# Patient Record
Sex: Female | Born: 2000 | Race: Black or African American | Hispanic: No | Marital: Single | Smoking: Current every day smoker
Health system: Southern US, Community
[De-identification: ages and names within clinical notes are randomized; demographics above are authoritative.]

## PROBLEM LIST (undated history)

## (undated) DIAGNOSIS — L732 Hidradenitis suppurativa: Secondary | ICD-10-CM

## (undated) DIAGNOSIS — F209 Schizophrenia, unspecified: Secondary | ICD-10-CM

---
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
## 2020-04-09 ENCOUNTER — Other Ambulatory Visit: Payer: Self-pay

## 2020-04-09 ENCOUNTER — Ambulatory Visit (HOSPITAL_COMMUNITY)
Admission: EM | Admit: 2020-04-09 | Discharge: 2020-04-10 | Disposition: A | Discharge status: Other Acute Inpatient Hospital | Payer: Medicaid Other | Attending: Psychiatry | Admitting: Psychiatry

## 2020-04-09 DIAGNOSIS — Z20822 Contact with and (suspected) exposure to covid-19: Secondary | ICD-10-CM | POA: Insufficient documentation

## 2020-04-09 DIAGNOSIS — F29 Unspecified psychosis not due to a substance or known physiological condition: Secondary | ICD-10-CM | POA: Insufficient documentation

## 2020-04-09 DIAGNOSIS — Z818 Family history of other mental and behavioral disorders: Secondary | ICD-10-CM | POA: Insufficient documentation

## 2020-04-09 DIAGNOSIS — Z046 Encounter for general psychiatric examination, requested by authority: Secondary | ICD-10-CM | POA: Insufficient documentation

## 2020-04-09 LAB — RESP PANEL BY RT-PCR (FLU A&B, COVID) ARPGX2
Influenza A by PCR: NEGATIVE
Influenza B by PCR: NEGATIVE
SARS Coronavirus 2 by RT PCR: NEGATIVE

## 2020-04-09 LAB — CBC WITH DIFFERENTIAL/PLATELET
Abs Immature Granulocytes: 0.01 10*3/uL (ref 0.00–0.07)
Basophils Absolute: 0 10*3/uL (ref 0.0–0.1)
Basophils Relative: 0 %
Eosinophils Absolute: 0.1 10*3/uL (ref 0.0–0.5)
Eosinophils Relative: 2 %
HCT: 41.9 % (ref 36.0–46.0)
Hemoglobin: 13.9 g/dL (ref 12.0–15.0)
Immature Granulocytes: 0 %
Lymphocytes Relative: 46 %
Lymphs Abs: 2.4 10*3/uL (ref 0.7–4.0)
MCH: 30.8 pg (ref 26.0–34.0)
MCHC: 33.2 g/dL (ref 30.0–36.0)
MCV: 92.9 fL (ref 80.0–100.0)
Monocytes Absolute: 0.7 10*3/uL (ref 0.1–1.0)
Monocytes Relative: 13 %
Neutro Abs: 2 10*3/uL (ref 1.7–7.7)
Neutrophils Relative %: 39 %
Platelets: 310 10*3/uL (ref 150–400)
RBC: 4.51 MIL/uL (ref 3.87–5.11)
RDW: 12.5 % (ref 11.5–15.5)
WBC: 5.2 10*3/uL (ref 4.0–10.5)
nRBC: 0 % (ref 0.0–0.2)

## 2020-04-09 LAB — POCT URINE DRUG SCREEN - MANUAL ENTRY (I-SCREEN)
POC Amphetamine UR: NOT DETECTED
POC Buprenorphine (BUP): NOT DETECTED
POC Cocaine UR: NOT DETECTED
POC Morphine: NOT DETECTED
POC Oxazepam (BZO): NOT DETECTED
POC Oxycodone UR: NOT DETECTED

## 2020-04-09 LAB — ETHANOL: Alcohol, Ethyl (B): <10 mg/dL (ref <10)

## 2020-04-09 LAB — POCT URINE DRUG SCREEN - MANUAL ENTRY (I-CUP)
POC Marijuana UR: POSITIVE — AB
POC Methadone UR: NOT DETECTED
POC Methamphetamine UR: NOT DETECTED
POC Secobarbital (BAR): NOT DETECTED

## 2020-04-09 LAB — COMPREHENSIVE METABOLIC PANEL
ALT: 17 U/L (ref 0–44)
AST: 23 U/L (ref 15–41)
Albumin: 4.3 g/dL (ref 3.5–5.0)
Alkaline Phosphatase: 60 U/L (ref 38–126)
Anion gap: 5 (ref 5–15)
BUN: 12 mg/dL (ref 6–20)
CO2: 28 mmol/L (ref 22–32)
Calcium: 9.3 mg/dL (ref 8.9–10.3)
Chloride: 104 mmol/L (ref 98–111)
Creatinine, Ser: 0.81 mg/dL (ref 0.44–1.00)
GFR, Estimated: >60 mL/min (ref >60)
Glucose, Bld: 74 mg/dL (ref 70–99)
Potassium: 4.4 mmol/L (ref 3.5–5.1)
Sodium: 137 mmol/L (ref 135–145)
Total Bilirubin: 0.6 mg/dL (ref 0.3–1.2)
Total Protein: 7.3 g/dL (ref 6.5–8.1)

## 2020-04-09 LAB — HEMOGLOBIN A1C
Hgb A1c MFr Bld: 5.3 % (ref 4.8–5.6)
Mean Plasma Glucose: 105.41 mg/dL

## 2020-04-09 LAB — TSH: TSH: 0.662 u[IU]/mL (ref 0.350–4.500)

## 2020-04-09 LAB — POC SARS CORONAVIRUS 2 AG: SARS Coronavirus 2 Ag: NEGATIVE

## 2020-04-09 LAB — LIPID PANEL
Cholesterol: 238 mg/dL — ABNORMAL HIGH (ref 0–200)
HDL: 93 mg/dL (ref >40)
LDL Cholesterol: 134 mg/dL — ABNORMAL HIGH (ref 0–99)
Total CHOL/HDL Ratio: 2.6 RATIO
Triglycerides: 54 mg/dL (ref <150)
VLDL: 11 mg/dL (ref 0–40)

## 2020-04-09 LAB — HCG, QUANTITATIVE, PREGNANCY: hCG, Beta Chain, Quant, S: <1 m[IU]/mL (ref <5)

## 2020-04-09 LAB — POCT PREGNANCY, URINE: Preg Test, Ur: NEGATIVE

## 2020-04-09 MED ORDER — LORAZEPAM 1 MG PO TABS: 1.0000 mg | ORAL_TABLET | ORAL | Status: DC | PRN

## 2020-04-09 MED ORDER — LORAZEPAM 2 MG/ML IJ SOLN
1.0000 mg | Freq: Once | INTRAMUSCULAR | Status: AC
  Administered 2020-04-09: 1 mg via INTRAMUSCULAR
  Filled 2020-04-09: qty 1

## 2020-04-09 MED ORDER — HYDROXYZINE HCL 25 MG PO TABS: 25.0000 mg | ORAL_TABLET | Freq: Three times a day (TID) | ORAL | Status: DC | PRN

## 2020-04-09 MED ORDER — OLANZAPINE 5 MG PO TBDP: 5.0000 mg | ORAL_TABLET | Freq: Three times a day (TID) | ORAL | Status: DC | PRN

## 2020-04-09 MED ORDER — MAGNESIUM HYDROXIDE 400 MG/5ML PO SUSP: 30.0000 mL | Freq: Every day | ORAL | Status: DC | PRN

## 2020-04-09 MED ORDER — TRAZODONE HCL 50 MG PO TABS: 50.0000 mg | ORAL_TABLET | Freq: Every evening | ORAL | Status: DC | PRN

## 2020-04-09 MED ORDER — ACETAMINOPHEN 325 MG PO TABS: 650.0000 mg | ORAL_TABLET | Freq: Four times a day (QID) | ORAL | Status: DC | PRN

## 2020-04-09 MED ORDER — ZIPRASIDONE MESYLATE 20 MG IM SOLR
20.0000 mg | INTRAMUSCULAR | Status: AC | PRN
  Administered 2020-04-09: 20 mg via INTRAMUSCULAR
  Filled 2020-04-09: qty 20

## 2020-04-09 MED ORDER — OLANZAPINE 5 MG PO TABS: 5.0000 mg | ORAL_TABLET | Freq: Once | ORAL | Status: DC

## 2020-04-09 MED ORDER — ALUM & MAG HYDROXIDE-SIMETH 200-200-20 MG/5ML PO SUSP: 30.0000 mL | ORAL | Status: DC | PRN

## 2020-04-09 NOTE — Progress Notes (Signed)
Patient admitted to [facility] under IVC. Patient is labile, hyperreligious, Constantly yelling. Refuses oral medications. Order placed for manual hold.

## 2020-04-09 NOTE — ED Notes (Signed)
Pt agitated, refusing to transfer from rm 136 to [facility] unit.  [employer] at bedside to assist with transfer to [facility].  STARR Packet initiated.  Pt tolerated injections without incident.

## 2020-04-09 NOTE — ED Notes (Signed)
Pt sleeping at present, sedated, respirations even & unlabored.  No distress noted. Monitoring for safety.

## 2020-04-09 NOTE — ED Notes (Signed)
Pt sleeping@this time. Breathing even and unlabored. Will continue to monitor for safety 
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
## 2020-04-10 ENCOUNTER — Inpatient Hospital Stay (HOSPITAL_COMMUNITY)
Admission: AD | Admit: 2020-04-10 | Discharge: 2020-04-16 | DRG: 885 | Disposition: A | Discharge status: Home / Self Care | Payer: Medicaid Other | Source: Intra-hospital | Attending: Psychiatry | Admitting: Psychiatry

## 2020-04-10 DIAGNOSIS — G4700 Insomnia, unspecified: Secondary | ICD-10-CM | POA: Diagnosis present

## 2020-04-10 DIAGNOSIS — Z818 Family history of other mental and behavioral disorders: Secondary | ICD-10-CM | POA: Diagnosis not present

## 2020-04-10 DIAGNOSIS — Z20822 Contact with and (suspected) exposure to covid-19: Secondary | ICD-10-CM | POA: Diagnosis present

## 2020-04-10 DIAGNOSIS — F39 Unspecified mood [affective] disorder: Principal | ICD-10-CM | POA: Diagnosis present

## 2020-04-10 DIAGNOSIS — F419 Anxiety disorder, unspecified: Secondary | ICD-10-CM | POA: Diagnosis present

## 2020-04-10 MED ORDER — TRAZODONE HCL 50 MG PO TABS
50.0000 mg | ORAL_TABLET | Freq: Every evening | ORAL | Status: DC | PRN
Start: 2020-04-10 — End: 2020-10-04

## 2020-04-10 MED ORDER — MAGNESIUM HYDROXIDE 400 MG/5ML PO SUSP
30.0000 mL | Freq: Every day | ORAL | Status: DC | PRN
  Administered 2020-04-14: 30 mL via ORAL
  Filled 2020-04-10: qty 30

## 2020-04-10 MED ORDER — HYDROXYZINE HCL 25 MG PO TABS: 25.0000 mg | ORAL_TABLET | Freq: Three times a day (TID) | ORAL | 0 refills | Status: DC | PRN

## 2020-04-10 MED ORDER — ALUM & MAG HYDROXIDE-SIMETH 200-200-20 MG/5ML PO SUSP: 30.0000 mL | ORAL | Status: DC | PRN

## 2020-04-10 MED ORDER — OLANZAPINE 5 MG PO TBDP: 5.0000 mg | ORAL_TABLET | Freq: Every day | ORAL | Status: DC

## 2020-04-10 MED ORDER — OLANZAPINE 5 MG PO TBDP
5.0000 mg | ORAL_TABLET | Freq: Every day | ORAL | Status: DC
  Administered 2020-04-11 (×2): 5 mg via ORAL
  Filled 2020-04-10 (×5): qty 1

## 2020-04-10 MED ORDER — ACETAMINOPHEN 325 MG PO TABS
650.0000 mg | ORAL_TABLET | Freq: Four times a day (QID) | ORAL | Status: DC | PRN
  Administered 2020-04-11: 650 mg via ORAL
  Filled 2020-04-10: qty 2

## 2020-04-10 MED ORDER — TRAZODONE HCL 50 MG PO TABS
50.0000 mg | ORAL_TABLET | Freq: Every evening | ORAL | Status: DC | PRN
  Administered 2020-04-11: 50 mg via ORAL
  Filled 2020-04-10 (×2): qty 1

## 2020-04-10 NOTE — ED Notes (Signed)
Report called to RN Rudi, [facility] rm [id number].  Pending Safe Transport transfer.

## 2020-04-10 NOTE — Medical Student Note (Incomplete)
[facility] URGENT CARE Provider Student Note For educational purposes for Medical, PA and NP students only and not part of the legal medical record.   CSN: [id number] Arrival date & time: 04/09/20  1635      History   Chief Complaint Chief Complaint  Patient presents with  . IVC    Agitation    HPI Kiara [name] is a 20 y.o. female.  HPI  No past medical history on file.  There are no problems to display for this patient.   No past surgical history on file.  OB History   No obstetric history on file.      Home Medications    Prior to Admission medications   Not on File    Family History No family history on file.  Social History Social History   Tobacco Use  . Smoking status: Never Smoker  . Smokeless tobacco: Never Used  Substance Use Topics  . Alcohol use: No  . Drug use: No     Allergies   Patient has no known allergies.   Review of Systems Review of Systems   Physical Exam Updated Vital Signs BP (!) 134/95 (BP Location: Left Arm) Comment: notified Renae T,LPN  Pulse 86   Temp 98.4 F (36.9 C) (Temporal)   Resp 18   SpO2 99%   Physical Exam   ED Treatments / Results  Labs (all labs ordered are listed, but only abnormal results are displayed) Labs Reviewed  LIPID PANEL - Abnormal; Notable for the following components:      Result Value   Cholesterol 238 (*)    LDL Cholesterol 134 (*)    All other components within normal limits  POCT URINE DRUG SCREEN - MANUAL ENTRY (I-CUP) - Abnormal; Notable for the following components:   POC Marijuana UR Positive (*)    All other components within normal limits  RESP PANEL BY RT-PCR (FLU A&B, COVID) ARPGX2  TSH  HEMOGLOBIN A1C  CBC WITH DIFFERENTIAL/PLATELET  COMPREHENSIVE METABOLIC PANEL  ETHANOL  HCG, QUANTITATIVE, PREGNANCY  PREGNANCY, URINE  POC SARS CORONAVIRUS 2 AG -  ED  POC SARS CORONAVIRUS 2 AG  POCT PREGNANCY, URINE    EKG  Radiology No results  found.  Procedures Procedures (including critical care time)  Medications Ordered in ED Medications  acetaminophen (TYLENOL) tablet 650 mg (has no administration in time range)  alum & mag hydroxide-simeth (MAALOX/MYLANTA) 200-200-20 MG/5ML suspension 30 mL (has no administration in time range)  magnesium hydroxide (MILK OF MAGNESIA) suspension 30 mL (has no administration in time range)  hydrOXYzine (ATARAX/VISTARIL) tablet 25 mg (has no administration in time range)  traZODone (DESYREL) tablet 50 mg (has no administration in time range)  OLANZapine zydis (ZYPREXA) disintegrating tablet 5 mg (has no administration in time range)    And  LORazepam (ATIVAN) tablet 1 mg (has no administration in time range)    And  ziprasidone (GEODON) injection 20 mg (20 mg Intramuscular Given 04/09/20 2058)  OLANZapine (ZYPREXA) tablet 5 mg (5 mg Oral Not Given 04/09/20 2057)  LORazepam (ATIVAN) injection 1 mg (1 mg Intramuscular Given 04/09/20 2058)     Initial Impression / Assessment and Plan / ED Course  I have reviewed the triage vital signs and the nursing notes.  Pertinent labs & imaging results that were available during my care of the patient were reviewed by me and considered in my medical decision making (see chart for details).     ***  Final Clinical Impressions(s) /  ED Diagnoses   Final diagnoses:  Involuntary commitment  Psychosis, unspecified psychosis type [facility])    New Prescriptions New Prescriptions   No medications on file

## 2020-04-10 NOTE — ED Notes (Signed)
Pt denies any SI. Stated that she never saw a demon, "I don't know why my dad said that". Pt has tears in her eyes. Requested to have a copy of her IVC papers. Pt had a copy at admittance yesterday while having labwork completed. Pt currently resting on pull out bed. Safety maintained and will continue to monitor.

## 2020-04-10 NOTE — ED Notes (Signed)
Pt questioning why she is being admitted to another hospital. Crying and requesting to speak to the dr. Informed provider per private chat of Pt's request. Pt is stating that she has done nothing that is being said against her by her dad and sister. She stated that she is being driven backwards in life not being able to leave this facility. Stated that she would rather be in jail. Pt had requested the phone number for the dr, magistrate or whoever had anything to do with the IVC paperwork/process. Explained to Pt that staff is unable to give out phone numbers of the providers or magistrate. Pt is stating that she is not crazy while crying. Safety maintained and will continue to monitor.

## 2020-04-10 NOTE — ED Notes (Signed)
Pt A&O x 4, no distress noted, calm & cooperative at present, monitoring for safety.  Pending report & transfer to [facility] rm [id number] after 8pm.
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
## 2020-04-10 NOTE — Progress Notes (Signed)
Pt accepted to [facility] room 404-1  Patient meets inpatient criteria per Karsten Ro, MD   Dr. Jola Babinski is the attending provider.    Call report to [phone]    Laretta Alstrom, RN @ [facility] notified.     Pt scheduled  to arrive at [facility] at 2000 PM  Signed:  Corky Crafts, MSW, LCSWA, LCASA 04/10/2020 3:22 PM

## 2020-04-10 NOTE — ED Notes (Signed)
Pt taking shower.  

## 2020-04-10 NOTE — ED Notes (Signed)
Pt given breakfast.

## 2020-04-10 NOTE — Discharge Instructions (Addendum)
Transfer to [facility] 

## 2020-04-11 ENCOUNTER — Encounter (HOSPITAL_COMMUNITY): Payer: Self-pay | Admitting: Psychiatry

## 2020-04-11 ENCOUNTER — Other Ambulatory Visit: Payer: Self-pay

## 2020-04-11 MED ORDER — HYDROXYZINE HCL 25 MG PO TABS
25.0000 mg | ORAL_TABLET | Freq: Three times a day (TID) | ORAL | Status: DC | PRN
  Filled 2020-04-11: qty 1

## 2020-04-11 MED ORDER — LORAZEPAM 2 MG/ML IJ SOLN: 2.0000 mg | Freq: Four times a day (QID) | INTRAMUSCULAR | Status: DC | PRN

## 2020-04-11 MED ORDER — OLANZAPINE 10 MG PO TBDP: 10.0000 mg | ORAL_TABLET | Freq: Three times a day (TID) | ORAL | Status: DC | PRN

## 2020-04-11 MED ORDER — ZIPRASIDONE MESYLATE 20 MG IM SOLR: 20.0000 mg | INTRAMUSCULAR | Status: DC | PRN

## 2020-04-11 MED ORDER — LORAZEPAM 1 MG PO TABS: 1.0000 mg | ORAL_TABLET | Freq: Four times a day (QID) | ORAL | Status: DC | PRN

## 2020-04-11 MED ORDER — LORAZEPAM 1 MG PO TABS: 1.0000 mg | ORAL_TABLET | ORAL | Status: DC | PRN
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
## 2020-04-11 NOTE — Progress Notes (Signed)
Psychoeducational Group Note  Date:  04/11/2020 Time:  2015  Group Topic/Focus:  wrap up group  Participation Level: Did Not Attend  Participation Quality:  Not Applicable  Affect:  Not Applicable  Cognitive:  Not Applicable  Insight:  Not Applicable  Engagement in Group: Not Applicable  Additional Comments:  Did not attend.   Marcille Buffy 04/11/2020, 10:34 PM

## 2020-04-11 NOTE — Tx Team (Signed)
Initial Treatment Plan 04/11/2020 2:45 AM Kiara [name] OIZ:[id number]    PATIENT STRESSORS: Marital or family conflict Medication change or noncompliance   PATIENT STRENGTHS: Active sense of humor Supportive family/friends   PATIENT IDENTIFIED PROBLEMS: Manic episode  (Patient did not identify any goals at this time)                   DISCHARGE CRITERIA:  Improved stabilization in mood, thinking, and/or behavior Reduction of life-threatening or endangering symptoms to within safe limits Verbal commitment to aftercare and medication compliance  PRELIMINARY DISCHARGE PLAN: Outpatient therapy Return to previous living arrangement Return to previous work or school arrangements  PATIENT/FAMILY INVOLVEMENT: This treatment plan has been presented to and reviewed with the patient, Kiara [name], and/or family member.  The patient and family have been given the opportunity to ask questions and make suggestions.  Mancel Bale, RN 04/11/2020, 2:45 AM
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
## 2020-04-11 NOTE — Progress Notes (Signed)
Patient observed in her room while she was sitting on the bed eating breakfast. She reported that her father committed her because she was acting "wired" and after she put olive oil all over the wall after she had a bad dream. She stated "I don't play about having bad dreams." She reported that she has an upcoming court date in April or May for being accused of throwing a rock at a car. She reported that she was picking up rocks at the time and throwing them, but she did not throw a rock at the car. She reported that she's "a country girl" and throwing rocks helps her to cope. She reported that she was accused of throwing a rock at the car because she's homeless and they saw a homeless person (herself) outside at the time that it happened. She reported that she lives alone and sometimes she sleeps on the street when she doesn't have a place to stay. She denies SI/HI. She denies AVH. She is calm and cooperative. She is isolative to her room and stated that she doesn't like to be around a lot of people.   Orders reviewed. Vital signs reviewed. Verbal support provided. 15 minute checks performed for safety.   Patient receptive to treatment.

## 2020-04-11 NOTE — Progress Notes (Signed)
Pt isolated to her room this evening, pt minimizes her symptoms, pt took HS medication    04/11/20 2000  Psych Admission Type (Psych Patients Only)  Admission Status Voluntary  Psychosocial Assessment  Eye Contact Fair  Facial Expression Flat  Affect Anxious  Speech Logical/coherent  Interaction Isolative  Motor Activity Slow  Appearance/Hygiene Disheveled  Behavior Characteristics Guarded  Mood Preoccupied;Suspicious  Thought Process  Coherency Circumstantial;Concrete thinking  Content Preoccupation  Delusions None reported or observed  Perception WDL  Hallucination None reported or observed  Judgment Poor  Confusion None  Danger to Self  Current suicidal ideation? Denies  Danger to Others  Danger to Others None reported or observed

## 2020-04-11 NOTE — BHH Counselor (Signed)
CSW attempted to complete PSA w/ pt but she was sleeping. CSW will attempt again at a later time.  Fredirick Lathe, LCSWA Clinicial Social Worker [employer]

## 2020-04-12 MED ORDER — OLANZAPINE 5 MG PO TBDP: 5.0000 mg | ORAL_TABLET | Freq: Four times a day (QID) | ORAL | Status: DC | PRN

## 2020-04-12 MED ORDER — OLANZAPINE 5 MG PO TBDP
5.0000 mg | ORAL_TABLET | Freq: Two times a day (BID) | ORAL | Status: DC
  Filled 2020-04-12 (×10): qty 1

## 2020-04-12 MED ORDER — LORAZEPAM 2 MG/ML IJ SOLN: 2.0000 mg | Freq: Four times a day (QID) | INTRAMUSCULAR | Status: DC | PRN

## 2020-04-12 MED ORDER — LORAZEPAM 1 MG PO TABS
1.0000 mg | ORAL_TABLET | Freq: Once | ORAL | Status: DC
  Filled 2020-04-12: qty 1

## 2020-04-12 MED ORDER — LORAZEPAM 1 MG PO TABS: 2.0000 mg | ORAL_TABLET | Freq: Four times a day (QID) | ORAL | Status: DC | PRN

## 2020-04-12 MED ORDER — OLANZAPINE 5 MG PO TBDP
5.0000 mg | ORAL_TABLET | Freq: Once | ORAL | Status: DC
  Filled 2020-04-12 (×2): qty 1

## 2020-04-12 NOTE — BHH Group Notes (Signed)
Type of Therapy: Gratitude   Participation Level:  Did Not Attend  Summary of Progress/Problems: Pt did not attend the group

## 2020-04-12 NOTE — Progress Notes (Signed)
Recreation Therapy Notes  Date: 4.1.22 Time: 0930 Location: [address] Dayroom  Group Topic: Stress Management  Goal Area(s) Addresses:  Patient will actively participate in Anger management techniques presented during session.  Patient will successfully identify benefit of practicing Anger management post d/c.   Intervention: Stress management techniques  Activity : Progressive Muscle Relaxation  LRT provided education, instruction and demonstration on practice of Progressive Muscle Relaxation. Patient was asked to participate in technique introduced during session. Patient were to follow along as LRT read the script to engage in activity.  Education: Stress Management, Discharge Planning.   Education Outcome: Acknowledges education/In group clarification offered  Clinical Observations/Feedback: Patient did not attend group session.   Nadina Fomby, LRT/CTRS         Janelle Spellman A 04/12/2020 11:09 AM 

## 2020-04-12 NOTE — Tx Team (Signed)
Interdisciplinary Treatment and Diagnostic Plan Update  04/12/2020 Time of Session: 9:10am  Kiara [name] MRN: [id number]  Principal Diagnosis: Unspecified mood (affective) disorder ([place])  Secondary Diagnoses: Principal Problem:   Unspecified mood (affective) disorder ([place])   Current Medications:  Current Facility-Administered Medications  Medication Dose Route Frequency Provider Last Rate Last Admin  . acetaminophen (TYLENOL) tablet 650 mg  650 mg Oral Q6H PRN [name], MD   650 mg at 04/11/20 0122  . alum & mag hydroxide-simeth (MAALOX/MYLANTA) 200-200-20 MG/5ML suspension 30 mL  30 mL Oral Q4H PRN [name], MD      . hydrOXYzine (ATARAX/VISTARIL) tablet 25 mg  25 mg Oral TID PRN [name], MD      . LORazepam (ATIVAN) tablet 1 mg  1 mg Oral Q6H PRN [name], MD       Or  . LORazepam (ATIVAN) injection 2 mg  2 mg Intramuscular Q6H PRN [name], MD      . magnesium hydroxide (MILK OF MAGNESIA) suspension 30 mL  30 mL Oral Daily PRN [name], MD      . OLANZapine zydis (ZYPREXA) disintegrating tablet 5 mg  5 mg Oral QHS [name], MD   5 mg at 04/11/20 2018  . traZODone (DESYREL) tablet 50 mg  50 mg Oral QHS PRN [name], MD   50 mg at 04/11/20 0122   PTA Medications: Medications Prior to Admission  Medication Sig Dispense Refill Last Dose  . hydrOXYzine (ATARAX/VISTARIL) 25 MG tablet Take 1 tablet (25 mg total) by mouth 3 (three) times daily as needed for anxiety. 30 tablet 0   . OLANZapine zydis (ZYPREXA) 5 MG disintegrating tablet Take 1 tablet (5 mg total) by mouth at bedtime.     . traZODone (DESYREL) 50 MG tablet Take 1 tablet (50 mg total) by mouth at bedtime as needed for sleep.       Patient Stressors: Marital or family conflict Medication change or noncompliance  Patient Strengths: Active sense of humor Supportive family/friends  Treatment Modalities: Medication Management, Group therapy,  Case management,  1 to 1 session with clinician, Psychoeducation, Recreational therapy.   Physician Treatment Plan for Primary Diagnosis: Unspecified mood (affective) disorder ([place]) Long Term Goal(s): Improvement in symptoms so as ready for discharge Improvement in symptoms so as ready for discharge   Short Term Goals: Ability to identify changes in lifestyle to reduce recurrence of condition will improve Ability to verbalize feelings will improve Ability to disclose and discuss suicidal ideas Ability to demonstrate self-control will improve Ability to identify and develop effective coping behaviors will improve Compliance with prescribed medications will improve Ability to identify triggers associated with substance abuse/mental health issues will improve  Medication Management: Evaluate patient's response, side effects, and tolerance of medication regimen.  Therapeutic Interventions: 1 to 1 sessions, Unit Group sessions and Medication administration.  Evaluation of Outcomes: Not Met  Physician Treatment Plan for Secondary Diagnosis: Principal Problem:   Unspecified mood (affective) disorder ([place])  Long Term Goal(s): Improvement in symptoms so as ready for discharge Improvement in symptoms so as ready for discharge   Short Term Goals: Ability to identify changes in lifestyle to reduce recurrence of condition will improve Ability to verbalize feelings will improve Ability to disclose and discuss suicidal ideas Ability to demonstrate self-control will improve Ability to identify and develop effective coping behaviors will improve Compliance with prescribed medications will improve Ability to identify triggers associated with substance abuse/mental  health issues will improve     Medication Management: Evaluate patient's response, side effects, and tolerance of medication regimen.  Therapeutic Interventions: 1 to 1 sessions, Unit Group sessions and Medication  administration.  Evaluation of Outcomes: Not Met   RN Treatment Plan for Primary Diagnosis: Unspecified mood (affective) disorder ([place]) Long Term Goal(s): Knowledge of disease and therapeutic regimen to maintain health will improve  Short Term Goals: Ability to remain free from injury will improve, Ability to demonstrate self-control, Ability to participate in decision making will improve, Ability to verbalize feelings will improve, Ability to disclose and discuss suicidal ideas and Ability to identify and develop effective coping behaviors will improve  Medication Management: RN will administer medications as ordered by provider, will assess and evaluate patient's response and provide education to patient for prescribed medication. RN will report any adverse and/or side effects to prescribing provider.  Therapeutic Interventions: 1 on 1 counseling sessions, Psychoeducation, Medication administration, Evaluate responses to treatment, Monitor vital signs and CBGs as ordered, Perform/monitor CIWA, COWS, AIMS and Fall Risk screenings as ordered, Perform wound care treatments as ordered.  Evaluation of Outcomes: Not Met   LCSW Treatment Plan for Primary Diagnosis: Unspecified mood (affective) disorder ([place]) Long Term Goal(s): Safe transition to appropriate next level of care at discharge, Engage patient in therapeutic group addressing interpersonal concerns.  Short Term Goals: Engage patient in aftercare planning with referrals and resources, Increase social support, Increase emotional regulation, Facilitate acceptance of mental health diagnosis and concerns, Identify triggers associated with mental health/substance abuse issues and Increase skills for wellness and recovery  Therapeutic Interventions: Assess for all discharge needs, 1 to 1 time with Social worker, Explore available resources and support systems, Assess for adequacy in community support network, Educate family and significant  other(s) on suicide prevention, Complete Psychosocial Assessment, Interpersonal group therapy.  Evaluation of Outcomes: Not Met   Progress in Treatment: Attending groups: No. Participating in groups: No. Taking medication as prescribed: Yes. Toleration medication: Yes. Family/Significant other contact made: Yes, individual(s) contacted:  Mother and sister  Patient understands diagnosis: No. Discussing patient identified problems/goals with staff: Yes. Medical problems stabilized or resolved: Yes. Denies suicidal/homicidal ideation: Yes. Issues/concerns per patient self-inventory: No.   New problem(s) identified: No, Describe:  None   New Short Term/Long Term Goal(s): medication stabilization, elimination of SI thoughts, development of comprehensive mental wellness plan.   Patient Goals: "To have more patience and to get more sleep"  Discharge Plan or Barriers: Patient recently admitted. CSW will continue to follow and assess for appropriate referrals and possible discharge planning.   Reason for Continuation of Hospitalization: Delusions  Depression Mania Medication stabilization  Estimated Length of Stay: 3 to 5 days   Attendees: Patient: Kiara [name]  04/12/2020   Physician: Viann Fish, MD 04/12/2020   Nursing:  04/12/2020   RN Care Manager: 04/12/2020   Social Worker: Verdis Frederickson, [place] 04/12/2020   Recreational Therapist:  04/12/2020   Other:  04/12/2020   Other:  04/12/2020   Other: 04/12/2020     Scribe for Treatment Team: Darleen Crocker, [place] 04/12/2020 11:24 AM

## 2020-04-12 NOTE — BHH Group Notes (Signed)
Adult Psychoeducational Group Note  Date:  04/12/2020 Time:  11:07 AM  Group Topic/Focus:  Managing Feelings:   The focus of this group is to identify what feelings patients have difficulty handling and develop a plan to handle them in a healthier way upon discharge.  Participation Level:  Did Not Attend  Deforest Hoyles [facility] 04/12/2020, 11:07 AM

## 2020-04-12 NOTE — Plan of Care (Signed)
  Problem: Education: Goal: Knowledge of [facility] Education information/materials will improve Outcome: Progressing Goal: Emotional status will improve Outcome: Progressing Goal: Mental status will improve Outcome: Progressing   

## 2020-04-12 NOTE — Progress Notes (Signed)
Progress note  Pt refusing meds   04/12/20 0900  Psych Admission Type (Psych Patients Only)  Admission Status Voluntary  Psychosocial Assessment  Patient Complaints Anxiety  Eye Contact Fair  Facial Expression Anxious;Pensive  Affect Anxious;Preoccupied  Speech Logical/coherent  Interaction Assertive;Isolative  Motor Activity Slow  Appearance/Hygiene Disheveled  Behavior Characteristics Cooperative;Appropriate to situation;Anxious  Mood Anxious;Pleasant  Thought Process  Coherency Circumstantial;Concrete thinking  Content Preoccupation  Delusions None reported or observed  Perception WDL  Hallucination None reported or observed  Judgment Poor  Confusion None  Danger to Self  Current suicidal ideation? Denies  Danger to Others  Danger to Others None reported or observed

## 2020-04-12 NOTE — Progress Notes (Signed)
[facility] MD Progress Note  04/12/2020 3:00 PM Kiara [name]  MRN:  [id number] Subjective: The patient was seen and interviewed in her room.  Medical record reviewed.  Case discussed in detail with members of the treatment team.  On interview this morning, the patient states that she wants discharge because she wants to go home and spend time with her friends and is tired of eating hospital food.  She starts crying, perseverates on discharge and had to be redirected multiple times to engage in conversation rather than just repeating her request for discharge.  I advised her that she would not be discharged today.  Patient explained her tearfulness as "tears of exhaustion" because she wants to go home.  The patient denies sad or depressed mood.  She denies passive wish for death, SI, AI, HI, AH, VH, or PI but states that her father is against her because he put her in the hospital.  Patient describes her energy as "calm."  She states her thoughts are moving fast which is normal speed for her b/c her thoughts always move fast.  She reports that she is sleeping and eating okay.  She gives the team permission to contact her family.  She denies physical problems.  Objective: The patient declined to participate in social work assessment yesterday.  She did participate in social work assessment today.  She has not been attending groups. She took her standing dose olanzapine last night.  On my interview this morning with patient she was labile, irritable and tearful.  Patient was angry that she is in the hospital.  She perseverated on wanting discharge and had to be redirected multiple times to engage in other topics.  I advised her during her interaction that she would not be discharged today and we would evaluate her on a daily basis to determine her readiness to leave.  Staff reported that this afternoon the patient became agitated and angry demanding to leave, stating she was not going to take medication. PRN medications  are ordered but patient has not yet taken them   Principal Problem: Unspecified mood (affective) disorder (HCC) Diagnosis: Principal Problem:   Unspecified mood (affective) disorder (HCC)  Total Time spent with patient: 20 minutes  Past Psychiatric History: See H&P  Past Medical History: History reviewed. No pertinent past medical history. History reviewed. No pertinent surgical history. Family History: History reviewed. No pertinent family history. Family Psychiatric  History: See H&P  Social History:  Social History   Substance and Sexual Activity  Alcohol Use No     Social History   Substance and Sexual Activity  Drug Use No    Social History   Socioeconomic History  . Marital status: Single    Spouse name: Not on file  . Number of children: Not on file  . Years of education: Not on file  . Highest education level: Not on file  Occupational History  . Not on file  Tobacco Use  . Smoking status: Never Smoker  . Smokeless tobacco: Never Used  Substance and Sexual Activity  . Alcohol use: No  . Drug use: No  . Sexual activity: Never  Other Topics Concern  . Not on file  Social History Narrative  . Not on file   Social Determinants of Health   Financial Resource Strain: Not on file  Food Insecurity: Not on file  Transportation Needs: Not on file  Physical Activity: Not on file  Stress: Not on file  Social Connections: Not on file  Additional Social History:                         Sleep: Good  Appetite:  Fair  Current Medications: Current Facility-Administered Medications  Medication Dose Route Frequency Provider Last Rate Last Admin  . acetaminophen (TYLENOL) tablet 650 mg  650 mg Oral Q6H PRN Estella Husk, MD   650 mg at 04/11/20 0122  . alum & mag hydroxide-simeth (MAALOX/MYLANTA) 200-200-20 MG/5ML suspension 30 mL  30 mL Oral Q4H PRN Estella Husk, MD      . hydrOXYzine (ATARAX/VISTARIL) tablet 25 mg  25 mg Oral TID PRN  Claudie Revering, MD      . LORazepam (ATIVAN) tablet 2 mg  2 mg Oral Q6H PRN Claudie Revering, MD       Or  . LORazepam (ATIVAN) injection 2 mg  2 mg Intramuscular Q6H PRN Claudie Revering, MD      . LORazepam (ATIVAN) tablet 1 mg  1 mg Oral Once Claudie Revering, MD      . magnesium hydroxide (MILK OF MAGNESIA) suspension 30 mL  30 mL Oral Daily PRN Estella Husk, MD      . OLANZapine zydis (ZYPREXA) disintegrating tablet 5 mg  5 mg Oral QHS Estella Husk, MD   5 mg at 04/11/20 2018  . OLANZapine zydis (ZYPREXA) disintegrating tablet 5 mg  5 mg Oral Once Claudie Revering, MD      . OLANZapine zydis (ZYPREXA) disintegrating tablet 5 mg  5 mg Oral Q6H PRN Claudie Revering, MD      . traZODone (DESYREL) tablet 50 mg  50 mg Oral QHS PRN Estella Husk, MD   50 mg at 04/11/20 0122    Lab Results: No results found for this or any previous visit (from the past 48 hour(s)).  Blood Alcohol level:  Lab Results  Component Value Date   ETH <10 04/09/2020    Metabolic Disorder Labs: Lab Results  Component Value Date   HGBA1C 5.3 04/09/2020   MPG 105.41 04/09/2020   No results found for: PROLACTIN Lab Results  Component Value Date   CHOL 238 (H) 04/09/2020   TRIG 54 04/09/2020   HDL 93 04/09/2020   CHOLHDL 2.6 04/09/2020   VLDL 11 04/09/2020   LDLCALC 134 (H) 04/09/2020    Physical Findings: AIMS: Facial and Oral Movements Muscles of Facial Expression: None, normal Lips and Perioral Area: None, normal Jaw: None, normal Tongue: None, normal,Extremity Movements Upper (arms, wrists, hands, fingers): None, normal Lower (legs, knees, ankles, toes): None, normal, Trunk Movements Neck, shoulders, hips: None, normal, Overall Severity Severity of abnormal movements (highest score from questions above): None, normal Incapacitation due to abnormal movements: None, normal Patient's awareness of abnormal movements (rate only patient's report): No Awareness, Dental Status Current  problems with teeth and/or dentures?: No Does patient usually wear dentures?: No  CIWA:    COWS:     Musculoskeletal: Strength & Muscle Tone: within normal limits Gait & Station: normal Patient leans: N/A  Psychiatric Specialty Exam:  Presentation  General Appearance: Casual; Fairly Groomed  Eye Contact:Fleeting  Speech:Clear and Coherent  Speech Volume:Increased  Handedness:No data recorded  Mood and Affect  Mood:Dysphoric; Irritable; Labile  Affect:Labile; Tearful   Thought Process  Thought Processes:Coherent  Descriptions of Associations:Tangential  Orientation:Full (Time, Place and Person)  Thought Content:Perseveration; Illogical (perseveration on discharge)  History of Schizophrenia/Schizoaffective disorder:No  Duration of Psychotic Symptoms:Less than  six months  Hallucinations:Hallucinations: None  Ideas of Reference:None  Suicidal Thoughts:Suicidal Thoughts: No  Homicidal Thoughts:Homicidal Thoughts: No   Sensorium  Memory:Immediate Fair; Recent Fair; Remote Fair  Judgment:Poor  Insight:Poor   Executive Functions  Concentration:Fair  Attention Span:Fair  Recall:Fair  Fund of Knowledge:Fair  Language:Good   Psychomotor Activity  Psychomotor Activity:Psychomotor Activity: Normal   Assets  Assets:Physical Health; Resilience   Sleep  Sleep:Sleep: Good    Physical Exam: Physical Exam Vitals and nursing note reviewed.  HENT:     Head: Normocephalic and atraumatic.  Pulmonary:     Effort: Pulmonary effort is normal.  Neurological:     General: No focal deficit present.     Mental Status: She is alert.    Review of Systems  Constitutional: Negative for diaphoresis and fever.  HENT: Negative for sore throat.   Eyes: Negative for blurred vision.  Respiratory: Negative for cough and shortness of breath.   Cardiovascular: Negative for chest pain and palpitations.  Gastrointestinal: Negative for constipation, diarrhea,  nausea and vomiting.  Genitourinary: Negative for dysuria.  Musculoskeletal: Negative for joint pain and myalgias.  Skin: Negative for rash.  Neurological: Negative for dizziness, tremors and headaches.  Psychiatric/Behavioral: Negative for depression, hallucinations and suicidal ideas.   Blood pressure 122/87, pulse 74, temperature 98.8 F (37.1 C), temperature source Oral, resp. rate 18, height 5\' 1"  (1.549 m), weight 49 kg, SpO2 100 %. Body mass index is 20.41 kg/m.   Treatment Plan Summary: 20 year old female with no prior past psychiatric history admitted on IVC taken out by father and in the ED patient appeared to be responding to internal stimuli, laughing inappropriately, tangential, grandiose, euphoric, agitated and hyperreligious.  The patient continues with mood lability, intermittent agitation, irritability, anger, tearfulness and poor insight requiring continued inpatient hospitalization for evaluation, treatment and stabilization. Mood symptoms appear c/w primary mood disorder with current episode manic. ODD remains on differential.   Daily contact with patient to assess and evaluate symptoms and progress in treatment and Medication management   Continue IVC status  Continue every 15-minute observation status for safety of patient  Increase olanzapine to 5mg  QAM and 5mg  QHS for mood symptoms   Encourage participation in therapeutic milieu  SW to contact collaterals to expand database.   I certify that inpatient services furnished can reasonably be expected to improve the patient's condition.    26, MD 04/12/2020, 3:00 PM

## 2020-04-12 NOTE — Progress Notes (Signed)
Pt refused her medication, pt stated she did not believe she needed the medication. Pt stated it made her too drowsy and pt wanted to be clear headed when she talked to the doctor. Pt educated on the process if the doctor believed she needed to take medications , that the doctor could IVC her and force her to take medications. Pt was encouraged to talk to the doctor    04/12/20 2100  Psych Admission Type (Psych Patients Only)  Admission Status Voluntary  Psychosocial Assessment  Patient Complaints Anxiety  Eye Contact Fair  Facial Expression Anxious;Pensive  Affect Anxious;Preoccupied  Speech Logical/coherent  Interaction Assertive;Isolative  Motor Activity Slow  Appearance/Hygiene Disheveled  Behavior Characteristics Cooperative;Anxious  Mood Pleasant;Anxious  Thought Process  Coherency Circumstantial;Concrete thinking  Content Preoccupation  Delusions None reported or observed  Perception WDL  Hallucination None reported or observed  Judgment Poor  Confusion None  Danger to Self  Current suicidal ideation? Denies  Danger to Others  Danger to Others None reported or observed
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
## 2020-04-13 DIAGNOSIS — F39 Unspecified mood [affective] disorder: Principal | ICD-10-CM

## 2020-04-13 NOTE — Progress Notes (Signed)
D. Pt presents with an animated affect- labile mood-   observed attending group and interacting appropriately with peers and staff.  Per pt's self inventory, pt rated her depression, hopelessness and anxiety all 0's today.  Pt stated that her goal today is "to be myself" and "to do better and leave".  Pt currently denies SI/HI and AVH  A. Labs and vitals monitored. Pt refused her medication this am, stating that she "wanted to speak to the doctor first", and complained that the medciation made her "too sleepy".  Pt supported emotionally and encouraged to express concerns and ask questions.   R. Pt remains safe with 15 minute checks. Will continue POC.

## 2020-04-13 NOTE — Progress Notes (Addendum)
Patient's mother (Alfreda Banbury)called, stating that someone from this facility called her.  Writer was unable to confirm or deny patient's status without code. Mother went on to report that she wants her daughter to get help.  Mother states that patient has been using synthetic weed and crack cocaine, and she was very concerned for her. Mother believes  that patient probably called her with intentions to be discharged.  Patient later observed agitated, talking loudly on the phone to her mother.

## 2020-04-13 NOTE — Progress Notes (Signed)
[facility] MD Progress Note  04/13/2020 3:45 PM Hampton Cost  MRN:  [id number]   The patient was seen and interviewed in her room.  Medical record reviewed.  She reports, "I'm good."  When asked her to clarify what is good, she stated, "everything, I'm happy, tired of being here."  Interacting in the milieu appropriately with peers and staff, attending and participating in groups.  When asked about any side effects of her medications, she stated "I don't take any," which is not supported by staff--compliant with her Zyprexa.  She stated she does not need to be here.  She said her father found drug paraphernalia that he thought she used and thought "I'm a crack head as I walk everywhere because I don't have a ride.  I need to get my social security and find a job."  Denies suicidal/homicidal ideations, hallucinations, and other concerns.  Sleep is "good" and appetite is "perfect".  No side effects or physical issues.  Perseverates about discharging.  Principal Problem: Unspecified mood (affective) disorder (HCC) Diagnosis: Principal Problem:   Unspecified mood (affective) disorder (HCC)  Total Time spent with patient: 20 minutes  Past Psychiatric History: See H&P  Past Medical History: History reviewed. No pertinent past medical history. History reviewed. No pertinent surgical history. Family History: History reviewed. No pertinent family history. Family Psychiatric  History: See H&P  Social History:  Social History   Substance and Sexual Activity  Alcohol Use No     Social History   Substance and Sexual Activity  Drug Use No    Social History   Socioeconomic History  . Marital status: Single    Spouse name: Not on file  . Number of children: Not on file  . Years of education: Not on file  . Highest education level: Not on file  Occupational History  . Not on file  Tobacco Use  . Smoking status: Never Smoker  . Smokeless tobacco: Never Used  Substance and Sexual Activity  .  Alcohol use: No  . Drug use: No  . Sexual activity: Never  Other Topics Concern  . Not on file  Social History Narrative  . Not on file   Social Determinants of Health   Financial Resource Strain: Not on file  Food Insecurity: Not on file  Transportation Needs: Not on file  Physical Activity: Not on file  Stress: Not on file  Social Connections: Not on file   Additional Social History:    Sleep: Good  Appetite:  "perfect"  Current Medications: Current Facility-Administered Medications  Medication Dose Route Frequency Provider Last Rate Last Admin  . acetaminophen (TYLENOL) tablet 650 mg  650 mg Oral Q6H PRN Estella Husk, MD   650 mg at 04/11/20 0122  . alum & mag hydroxide-simeth (MAALOX/MYLANTA) 200-200-20 MG/5ML suspension 30 mL  30 mL Oral Q4H PRN Estella Husk, MD      . hydrOXYzine (ATARAX/VISTARIL) tablet 25 mg  25 mg Oral TID PRN Claudie Revering, MD      . magnesium hydroxide (MILK OF MAGNESIA) suspension 30 mL  30 mL Oral Daily PRN Estella Husk, MD      . OLANZapine zydis (ZYPREXA) disintegrating tablet 5 mg  5 mg Oral Once Claudie Revering, MD      . OLANZapine zydis (ZYPREXA) disintegrating tablet 5 mg  5 mg Oral Q6H PRN Claudie Revering, MD      . OLANZapine zydis (ZYPREXA) disintegrating tablet 5 mg  5 mg Oral BID  Claudie Revering, MD      . traZODone (DESYREL) tablet 50 mg  50 mg Oral QHS PRN Estella Husk, MD   50 mg at 04/11/20 0122    Lab Results: No results found for this or any previous visit (from the past 48 hour(s)).  Blood Alcohol level:  Lab Results  Component Value Date   ETH <10 04/09/2020    Metabolic Disorder Labs: Lab Results  Component Value Date   HGBA1C 5.3 04/09/2020   MPG 105.41 04/09/2020   No results found for: PROLACTIN Lab Results  Component Value Date   CHOL 238 (H) 04/09/2020   TRIG 54 04/09/2020   HDL 93 04/09/2020   CHOLHDL 2.6 04/09/2020   VLDL 11 04/09/2020   LDLCALC 134 (H) 04/09/2020     Physical Findings: AIMS: Facial and Oral Movements Muscles of Facial Expression: None, normal Lips and Perioral Area: None, normal Jaw: None, normal Tongue: None, normal,Extremity Movements Upper (arms, wrists, hands, fingers): None, normal Lower (legs, knees, ankles, toes): None, normal, Trunk Movements Neck, shoulders, hips: None, normal, Overall Severity Severity of abnormal movements (highest score from questions above): None, normal Incapacitation due to abnormal movements: None, normal Patient's awareness of abnormal movements (rate only patient's report): No Awareness, Dental Status Current problems with teeth and/or dentures?: No Does patient usually wear dentures?: No  CIWA:    COWS:     Musculoskeletal: Strength & Muscle Tone: within normal limits Gait & Station: normal Patient leans: N/A  Psychiatric Specialty Exam:  Presentation  General Appearance: Casual; Fairly Groomed  Eye Contact:Fleeting  Speech:Clear and Coherent  Speech Volume: WDL  Handedness: Right  Mood and Affect  Mood: Euthymia to euphoric  Affect: Congruent  Thought Process  Thought Processes:Coherent  Descriptions of Associations: Logical  Orientation:Full (Time, Place and Person)  Thought Content:Perseveration; Illogical (perseveration on discharge)  History of Schizophrenia/Schizoaffective disorder:No  Duration of Psychotic Symptoms:Less than six months  Hallucinations:Hallucinations: None  Ideas of Reference:None  Suicidal Thoughts:Suicidal Thoughts: No  Homicidal Thoughts:Homicidal Thoughts: No   Sensorium  Memory:Immediate Fair; Recent Fair; Remote Fair  Judgment: Fair  Insight:Poor   Executive Functions  Concentration:Fair  Attention Span:Fair  Recall:Fair  Fund of Knowledge:Fair  Language:Good   Psychomotor Activity  Psychomotor Activity:Psychomotor Activity: Normal   Assets  Assets:Physical Health; Resilience   Sleep  Sleep:Sleep:  Good    Physical Exam: Physical Exam Vitals and nursing note reviewed.  Constitutional:      Appearance: Normal appearance.  HENT:     Head: Normocephalic and atraumatic.     Nose: Nose normal.  Pulmonary:     Effort: Pulmonary effort is normal.  Musculoskeletal:        General: Normal range of motion.     Cervical back: Normal range of motion.  Neurological:     General: No focal deficit present.     Mental Status: She is alert and oriented to person, place, and time.  Psychiatric:        Attention and Perception: Attention and perception normal.        Mood and Affect: Mood and affect normal.        Speech: Speech normal.        Behavior: Behavior normal. Behavior is cooperative.        Thought Content: Thought content normal.        Cognition and Memory: Cognition and memory normal.        Judgment: Judgment is impulsive.    Review of Systems  Constitutional: Negative for diaphoresis and fever.  HENT: Negative for sore throat.   Eyes: Negative for blurred vision.  Respiratory: Negative for cough and shortness of breath.   Cardiovascular: Negative for chest pain and palpitations.  Gastrointestinal: Negative for constipation, diarrhea, nausea and vomiting.  Genitourinary: Negative for dysuria.  Musculoskeletal: Negative for joint pain and myalgias.  Skin: Negative for rash.  Neurological: Negative for dizziness, tremors and headaches.  Psychiatric/Behavioral: Negative for depression, hallucinations and suicidal ideas.  All other systems reviewed and are negative.  Blood pressure 122/87, pulse 74, temperature 98.8 F (37.1 C), temperature source Oral, resp. rate 18, height 5\' 1"  (1.549 m), weight 49 kg, SpO2 100 %. Body mass index is 20.41 kg/m.   Treatment Plan Summary: 20 year old female with no prior past psychiatric history admitted on IVC taken out by father and in the ED patient appeared to be responding to internal stimuli, laughing inappropriately,  tangential, grandiose, euphoric, agitated and hyperreligious.  The patient continues with mood lability, intermittent agitation, irritability, anger, tearfulness and poor insight requiring continued inpatient hospitalization for evaluation, treatment and stabilization. Mood symptoms appear c/w primary mood disorder with current episode manic.   Daily contact with patient to assess and evaluate symptoms and progress in treatment and Medication management   Unspecified mood disorder: Continue IVC status Continue every 15-minute observation status for safety of patient Continue Zyprexa 5 mg BID Encourage participation in therapeutic milieu  Anxiety: -Continue hydroxyzine 25 mg TID PRN -Discontinued Ativan PRN based on Zyprexa use could trigger BP and P instability  Insomnia: -Continue Trazodone 50 mg at bedtime PRN   26, NP 04/13/2020, 3:45 PM

## 2020-04-13 NOTE — Progress Notes (Signed)
   04/13/20 2304  COVID-19 Daily Checkoff  Have you had a fever (temp > 37.80C/100F)  in the past 24 hours?  No  If you have had runny nose, nasal congestion, sneezing in the past 24 hours, has it worsened? No  COVID-19 EXPOSURE  Have you traveled outside the state in the past 14 days? No  Have you been in contact with someone with a confirmed diagnosis of COVID-19 or PUI in the past 14 days without wearing appropriate PPE? No  Have you been living in the same home as a person with confirmed diagnosis of COVID-19 or a PUI (household contact)? No  Have you been diagnosed with COVID-19? No

## 2020-04-13 NOTE — Progress Notes (Signed)
   04/13/20 0500  Sleep  Number of Hours 6.75   

## 2020-04-13 NOTE — Progress Notes (Addendum)
   04/13/20 2306  Psych Admission Type (Psych Patients Only)  Admission Status Voluntary  Psychosocial Assessment  Patient Complaints None  Eye Contact Fair  Facial Expression Animated  Affect Labile  Speech Rapid  Interaction Assertive  Motor Activity Other (Comment) (WDL)  Appearance/Hygiene Disheveled  Behavior Characteristics Intrusive;Unwilling to participate;Guarded;Resistant to care;Restless  Mood Labile;Preoccupied  Thought Process  Coherency Circumstantial;Concrete thinking  Content Preoccupation;Delusions;Paranoia  Delusions Paranoid;Somatic  Perception Hallucinations  Hallucination Visual  Judgment Poor  Confusion Mild  Danger to Self  Current suicidal ideation? Denies  Danger to Others  Danger to Others None reported or observed   Pt reported that she saw worms in her feces, and was heard in room talking and laughing to self.  Pt continues to refuse scheduled medication.

## 2020-04-13 NOTE — [facility] Group Notes (Signed)
LCSW Group Therapy Note  04/13/2020    10:00-11:00am   Topic:  Anger Healthy and Unhealthy Coping Skills  Participation Level:  Active  Description of Group:   In this group, patients identified their own common triggers and typical reactions then analyzed how these reactions are possibly beneficial and possibly unhelpful.  Focus was placed on examining whether typical coping skills are healthy or unhealthy.  Therapeutic Goals: 1. Patients will share situations that commonly incite their anger and how they typically respond 2. Patients will identify how their coping skills work for them and/or against them 3. Patients will explore possible alternative coping skills 4. Patients will learn that anger itself is normal and that healthier reactions can assist with resolving conflict rather than worsening situations  Summary of Patient Progress:  The patient shared that she does not experience anger, although she agreed that she is "an emotional person."  She went on to say she can become "emotional" if not treated like a person, or if people are treated like "dogs or bitches,"  She said she can get through life "by myself."    She said that she is not an explosive person, "but can get that way."  She was intrusive several times during group and spoke condescendingly to other patients saying that they needed to work on something that she has already mastered such as "being nice to people because I'm a [place]."  Other patients showed their impatience with her statements, but CSW diverted her attention in order to deescalate the situation.  Therapeutic Modalities:   Cognitive Behavioral Therapy Processing  Lynnell Chad

## 2020-04-13 NOTE — [facility] Group Notes (Signed)
.  Psychoeducational Group Note  Date: 04-13-2020 Time: 0900-1000    Goal Setting   Purpose of Group: This group helps to provide patients with the steps of setting a goal that is specific, measurable, attainable, realistic and time specific. A discussion on how we keep ourselves stuck with negative self talk.    Participation Level:  Active  Participation Quality: inappropriate  Affect:  Appropriate  Cognitive:  immature   Insight:  lacking  Engagement in Group:  Engaged  Additional Comments:  Pt attended the group and was intrussive at times with the other patients. She laughed inappropriately, attempting to be funny. Rules were restated to her.  Dione Housekeeper

## 2020-04-13 NOTE — Progress Notes (Signed)

## 2020-04-13 NOTE — Progress Notes (Signed)
Psychoeducational Group Note  Date:  04/13/2020 Time: 2015  Group Topic/Focus:  wrap up group  Participation Level: Did Not Attend  Participation Quality:  Not Applicable  Affect:  Not Applicable  Cognitive:  Not Applicable  Insight:  Not Applicable  Engagement in Group: Not Applicable  Additional Comments:  Did not attend.   Marcille Buffy 04/13/2020, 10:43 PM

## 2020-04-14 DIAGNOSIS — F39 Unspecified mood [affective] disorder: Secondary | ICD-10-CM | POA: Diagnosis not present

## 2020-04-14 MED ORDER — OLANZAPINE 5 MG PO TBDP: 5.0000 mg | ORAL_TABLET | Freq: Three times a day (TID) | ORAL | Status: DC | PRN

## 2020-04-14 MED ORDER — MAGNESIUM CITRATE PO SOLN
1.0000 | Freq: Once | ORAL | Status: AC
  Administered 2020-04-14: 1 via ORAL
  Filled 2020-04-14 (×2): qty 296

## 2020-04-14 MED ORDER — LORAZEPAM 1 MG PO TABS: 1.0000 mg | ORAL_TABLET | ORAL | Status: DC | PRN

## 2020-04-14 MED ORDER — ZIPRASIDONE MESYLATE 20 MG IM SOLR: 20.0000 mg | INTRAMUSCULAR | Status: DC | PRN

## 2020-04-14 NOTE — Progress Notes (Signed)
[facility] MD Progress Note  04/14/2020 10:29 AM Kiara [name]  MRN:  [id number]   The patient was seen and interviewed in her room.  Medical record reviewed.  She was talking to someone in her room prior to the assessment, she stated, "I'm talking about God, it is Sunday, what's wrong with that."  Hyperverbal at times, hyper-religious, continues to minimize her symptoms, not taken medications in two days.  "I don't need medications, they make me too drowsy and I can't focus on my paperwork."  Sleep is "perfect", appetite is "great".  Stated she felt "gassy" and did not want everyone to know her business.  Communicated she lives where anyone will allow her stay, father "kicked me out at 2."  Receptive to moving in with her mother as she feels it would be safer for her than being on the streets or shelter.  Social worker reported in am meeting that she is offensive at times in group with statements like, "I'm a good person, y'all need to be good."  Continues to perseverate on discharging and "I'm only here for an examination."  Paranoid at times stating people including this provider "are lying to me."  Later, she asked my name and how to spell it as she wrote it, then asked to see my badge to verify it.  Pleasant with some frustration at times. She does report using Delta 8 at times and after it was mentioned, requested some of it.  Educated her on the negative effects including psychosis with the use of the synthetic cannabis.  Principal Problem: Unspecified mood (affective) disorder (HCC) Diagnosis: Principal Problem:   Unspecified mood (affective) disorder (HCC)  Total Time spent with patient: 20 minutes  Past Psychiatric History: See H&P  Past Medical History: History reviewed. No pertinent past medical history. History reviewed. No pertinent surgical history. Family History: History reviewed. No pertinent family history. Family Psychiatric  History: See H&P  Social History:  Social History    Substance and Sexual Activity  Alcohol Use No     Social History   Substance and Sexual Activity  Drug Use No    Social History   Socioeconomic History  . Marital status: Single    Spouse name: Not on file  . Number of children: Not on file  . Years of education: Not on file  . Highest education level: Not on file  Occupational History  . Not on file  Tobacco Use  . Smoking status: Never Smoker  . Smokeless tobacco: Never Used  Substance and Sexual Activity  . Alcohol use: No  . Drug use: No  . Sexual activity: Never  Other Topics Concern  . Not on file  Social History Narrative  . Not on file   Social Determinants of Health   Financial Resource Strain: Not on file  Food Insecurity: Not on file  Transportation Needs: Not on file  Physical Activity: Not on file  Stress: Not on file  Social Connections: Not on file   Additional Social History:    Sleep: Good  Appetite:  "perfect"  Current Medications: Current Facility-Administered Medications  Medication Dose Route Frequency Provider Last Rate Last Admin  . acetaminophen (TYLENOL) tablet 650 mg  650 mg Oral Q6H PRN Estella Husk, MD   650 mg at 04/11/20 0122  . alum & mag hydroxide-simeth (MAALOX/MYLANTA) 200-200-20 MG/5ML suspension 30 mL  30 mL Oral Q4H PRN Estella Husk, MD      . hydrOXYzine (ATARAX/VISTARIL) tablet 25 mg  25 mg Oral TID PRN Claudie Revering, MD      . magnesium hydroxide (MILK OF MAGNESIA) suspension 30 mL  30 mL Oral Daily PRN Estella Husk, MD   30 mL at 04/14/20 0827  . OLANZapine zydis (ZYPREXA) disintegrating tablet 5 mg  5 mg Oral Once Claudie Revering, MD      . OLANZapine zydis (ZYPREXA) disintegrating tablet 5 mg  5 mg Oral Q6H PRN Claudie Revering, MD      . OLANZapine zydis (ZYPREXA) disintegrating tablet 5 mg  5 mg Oral BID Claudie Revering, MD      . traZODone (DESYREL) tablet 50 mg  50 mg Oral QHS PRN Estella Husk, MD   50 mg at 04/11/20 0122     Lab Results: No results found for this or any previous visit (from the past 48 hour(s)).  Blood Alcohol level:  Lab Results  Component Value Date   ETH <10 04/09/2020    Metabolic Disorder Labs: Lab Results  Component Value Date   HGBA1C 5.3 04/09/2020   MPG 105.41 04/09/2020   No results found for: PROLACTIN Lab Results  Component Value Date   CHOL 238 (H) 04/09/2020   TRIG 54 04/09/2020   HDL 93 04/09/2020   CHOLHDL 2.6 04/09/2020   VLDL 11 04/09/2020   LDLCALC 134 (H) 04/09/2020    Physical Findings: AIMS: Facial and Oral Movements Muscles of Facial Expression: None, normal Lips and Perioral Area: None, normal Jaw: None, normal Tongue: None, normal,Extremity Movements Upper (arms, wrists, hands, fingers): None, normal Lower (legs, knees, ankles, toes): None, normal, Trunk Movements Neck, shoulders, hips: None, normal, Overall Severity Severity of abnormal movements (highest score from questions above): None, normal Incapacitation due to abnormal movements: None, normal Patient's awareness of abnormal movements (rate only patient's report): No Awareness, Dental Status Current problems with teeth and/or dentures?: No Does patient usually wear dentures?: No  CIWA:    COWS:     Musculoskeletal: Strength & Muscle Tone: within normal limits Gait & Station: normal Patient leans: N/A  Psychiatric Specialty Exam:  Presentation  General Appearance: Casual; Fairly Groomed  Eye Contact: Fair  IT sales professional Volume: WDL  Handedness: Right  Mood and Affect  Mood: Frustrated at times  Affect: Librarian, academic Processes:Coherent  Descriptions of Associations: Logical  Orientation:Full (Time, Place and Person)  Thought Content: Perseveration, paranoia  History of Schizophrenia/Schizoaffective disorder:No  Duration of Psychotic Symptoms:Less than six months  Hallucinations: Denies  Ideas of Reference:  None  Suicidal Thoughts: None  Homicidal Thoughts: None  Sensorium  Memory:Immediate Fair; Recent Fair; Remote Fair  Judgment: Fair  Insight:Poor   Executive Functions  Concentration:Fair  Attention Span:Fair  Recall:Fair  Fund of Knowledge:Fair  Language:Good   Psychomotor Activity  Psychomotor Activity: WDL  Assets  Assets:Physical Health; Resilience   Sleep  Sleep: Good   Physical Exam: Physical Exam Vitals and nursing note reviewed.  Constitutional:      Appearance: Normal appearance.  HENT:     Head: Normocephalic and atraumatic.     Nose: Nose normal.  Pulmonary:     Effort: Pulmonary effort is normal.  Musculoskeletal:        General: Normal range of motion.     Cervical back: Normal range of motion.  Neurological:     General: No focal deficit present.     Mental Status: She is alert and oriented to person, place, and time.  Psychiatric:  Attention and Perception: Attention and perception normal.        Mood and Affect: Mood and affect normal.        Speech: Speech normal.        Behavior: Behavior normal. Behavior is cooperative.        Thought Content: Thought content normal.        Cognition and Memory: Cognition and memory normal.        Judgment: Judgment is impulsive.    Review of Systems  Constitutional: Negative for diaphoresis and fever.  HENT: Negative for sore throat.   Eyes: Negative for blurred vision.  Respiratory: Negative for cough and shortness of breath.   Cardiovascular: Negative for chest pain and palpitations.  Gastrointestinal: Negative for constipation, diarrhea, nausea and vomiting.  Genitourinary: Negative for dysuria.  Musculoskeletal: Negative for joint pain and myalgias.  Skin: Negative for rash.  Neurological: Negative for dizziness, tremors and headaches.  Psychiatric/Behavioral: Negative for depression, hallucinations and suicidal ideas.  All other systems reviewed and are negative.  Blood  pressure 121/80, pulse 64, temperature 97.9 F (36.6 C), temperature source Oral, resp. rate 16, height 5\' 1"  (1.549 m), weight 49 kg, SpO2 100 %. Body mass index is 20.41 kg/m.   Treatment Plan Summary: 20 year old female with no prior past psychiatric history admitted on IVC taken out by father and in the ED patient appeared to be responding to internal stimuli, laughing inappropriately, tangential, grandiose, euphoric, agitated and hyperreligious.  The patient continues with mood lability, intermittent agitation, irritability, anger, tearfulness and poor insight requiring continued inpatient hospitalization for evaluation, treatment and stabilization. Mood symptoms appear c/w primary mood disorder with current episode manic with possible trigger synthetic cannabis, Delta 8.  Daily contact with patient to assess and evaluate symptoms and progress in treatment and Medication management   Unspecified mood disorder: Continue IVC status Continue every 15-minute observation status for safety of patient Continue Zyprexa 5 mg BID, client refusing Encourage participation in therapeutic milieu  Anxiety: -Continue hydroxyzine 25 mg TID PRN  Insomnia: -Continue Trazodone 50 mg at bedtime PRN   26, NP 04/14/2020, 10:29 AM

## 2020-04-14 NOTE — Progress Notes (Signed)
[place] NOVEL CORONAVIRUS (COVID-19) DAILY CHECK-OFF SYMPTOMS - answer yes or no to each - every day NO YES  Have you had a fever in the past 24 hours?  . Fever (Temp > 37.80C / 100F) X   Have you had any of these symptoms in the past 24 hours? . New Cough .  Sore Throat  .  Shortness of Breath .  Difficulty Breathing .  Unexplained Body Aches   X   Have you had any one of these symptoms in the past 24 hours not related to allergies?   . Runny Nose .  Nasal Congestion .  Sneezing   X   If you have had runny nose, nasal congestion, sneezing in the past 24 hours, has it worsened?  X   EXPOSURES - check yes or no X   Have you traveled outside the state in the past 14 days?  X   Have you been in contact with someone with a confirmed diagnosis of COVID-19 or PUI in the past 14 days without wearing appropriate PPE?  X   Have you been living in the same home as a person with confirmed diagnosis of COVID-19 or a PUI (household contact)?    X   Have you been diagnosed with COVID-19?    X              What to do next: Answered NO to all: Answered YES to anything:   Proceed with unit schedule Follow the [facility] Inpatient Flowsheet.   

## 2020-04-14 NOTE — Progress Notes (Signed)
D. Pt presents as animated, hyper-verbal, and friendly, but minimizes, continuing  to refuse her medications. Per pt's self inventory, pt rated her depression, hopelessness and anxiety all 0's. Pt continues to focus on discharge, and states that her goal today is "to leave and see if I can get my ID and social". Pt appeared sad this afternoon after getting off of the phone with her father, and afterwards refused to go outside for rec therapy, stating, "I don't have anyone to play with".  Pt currently denies SI/HI and AVH  A. Labs and vitals monitored. Pt given MOM for constipation, and encouraged to drink water.  Pt supported emotionally and encouraged to express concerns and ask questions.   R. Pt remains safe with 15 minute checks. Will continue POC.

## 2020-04-14 NOTE — [facility] Group Notes (Signed)
Adult Psychoeducational Group Not Date:  04/14/2020 Time:  0900-1045 Group Topic/Focus: PROGRESSIVE RELAXATION. A group where deep breathing is taught and tensing and relaxation muscle groups is used. Imagery is used as well.  Pts are asked to imagine 3 pillars that hold them up when they are not able to hold themselves up.   Dione Housekeeper

## 2020-04-14 NOTE — [facility] Group Notes (Signed)
[facility] Group Notes: (Clinical Social Work)   04/14/2020      Type of Therapy:  Group Therapy   Participation Level:  Did Not Attend - was invited both individually by MHT and by overhead announcement, chose not to attend.   Garima Chronis Grossman-Orr, LCSW 04/14/2020, 12:12 PM     

## 2020-04-14 NOTE — Progress Notes (Signed)
DAR PROGRESS NURSING NOTE  Patient continues to be animated, with rapid speech, labile and intrusive. She refused hs scheduled Zyprexa stating "it my right to refuse medications and I don't want to talk about it" Patient educated on medication. Denies SI/HI/A/VH appears to be responding to internal stimuli. Support and encouragement provided as needed. Patient remains safe at present. Q 15 minutes safety checks ongoing without self harm gestures.

## 2020-04-15 NOTE — [facility] Group Notes (Signed)
LCSW Group Therapy Notes  Type of Therapy and Topic: Group Therapy: Healthy Vs. Unhealthy Coping Strategies  Date and Time:   Participation Level: [facility] PARTICIPATION LEVEL: Did Not Attend  Description of Group:  In this group, patients will be encouraged to explore their healthy and unhealthy coping strategics. Coping strategies are actions that we take to deal with stress, problems, or uncomfortable emotions in our daily lives. Each patient will be challenged to read some scenarios and discuss the unhealthy and healthy coping strategies within those scenarios. Also, each patient will be challenged to describe current healthy and unhealthy strategies that they use in their own lives and discuss the outcomes and barriers to those strategies. This group will be process-oriented, with patients participating in exploration of their own experiences as well as giving and receiving support and challenge from other group members.  Therapeutic Goals: 1. Patient will identify personal healthy and unhealthy coping strategies. 2. Patient will identify healthy and unhealthy coping strategies, in others, through scenarios.  3. Patient will identify expected outcomes of healthy and unhealthy coping strategies. 4. Patient will identify barriers to using healthy coping strategies.   Summary of Patient Progress: Did not attend   Therapeutic Modalities: Cognitive Behavioral Therapy Solution Focused Therapy Motivational Interviewing    Dawsen Krieger MSW, LCSW Clincal Social Worker  [facility]  

## 2020-04-15 NOTE — Progress Notes (Signed)
   04/15/20 0940  Psych Admission Type (Psych Patients Only)  Admission Status Voluntary  Psychosocial Assessment  Patient Complaints None  Eye Contact Brief  Facial Expression Animated  Affect Labile  Speech Rapid  Interaction Assertive  Motor Activity Other (Comment)  Appearance/Hygiene Disheveled  Behavior Characteristics Unwilling to participate;Agitated;Intrusive;Pacing  Mood Labile;Irritable  Thought Process  Coherency Circumstantial;Concrete thinking  Content Preoccupation;Religiosity;Paranoia  Delusions Paranoid;Somatic  Perception Hallucinations  Hallucination Visual  Judgment Poor  Confusion Mild  Danger to Self  Current suicidal ideation? Denies  Danger to Others  Danger to Others None reported or observed

## 2020-04-15 NOTE — Progress Notes (Signed)
[facility] MD Progress Note  04/15/2020 4:07 PM Kiara [name]  MRN:  [id number]   Subjective: "I am doing better than I was earlier."  Objective: Medical record reviewed.  The patient's case was discussed in detail with members of the treatment team.  I attempted to meet with patient earlier in the day and she stated she was praying and requested that I return to meet with her later.  I met with the patient later and interviewed her in her room today.  On interview today, the patient denied depressed mood.  She denied wish for death, suicidal ideation, AI, HI, PI, AH or VH.  She reports that she is sleeping fine and her appetite is good.  She states that she does not want to take medication because she is sleeping fine without it and does not think she needs it.  The patient says she is willing to follow up with a therapist and with a provider for medication management as an outpatient in case she needs medication after discharge.  The patient states that she plans to stay with her mother upon discharge.  During my conversation with the patient she is awake and reclining in bed in her room.  She is appropriately engaged in our conversation.  The patient is calm with no motor abnormalities.  Eye contact is much improved.  She is cooperative and polite.  Speech is of normal rate volume and amount.  Mood is euthymic.  Affect is stable with appropriate range and reactivity.  Thought processes are linear coherent and goal-directed.  Thought content contains no suicidal or homicidal ideation, no paranoid ideation, no referential thinking or evidence of delusional content.  The patient denies hallucinations and does not appear to respond to internal stimuli.    The patient has not been taking her standing dose olanzapine for the past few days.  She has not taken any as needed medications.  Staff report that she has been animated, hyperverbal and friendly and perseverates on discharge.  The patient has not been attending  groups.  She has been eating and drinking adequately.  Her vital signs are within normal limits with temperature of 7.6, heart rate of 74, respiratory rate of 17 and blood pressure of 112/79.   Principal Problem: Unspecified mood (affective) disorder (HCC) Diagnosis: Principal Problem:   Unspecified mood (affective) disorder (HCC)  Total Time spent with patient: 15 minutes  Past Psychiatric History: See H&P  Past Medical History: History reviewed. No pertinent past medical history. History reviewed. No pertinent surgical history. Family History: History reviewed. No pertinent family history. Family Psychiatric  History: See H&P Social History:  Social History   Substance and Sexual Activity  Alcohol Use No     Social History   Substance and Sexual Activity  Drug Use No    Social History   Socioeconomic History  . Marital status: Single    Spouse name: Not on file  . Number of children: Not on file  . Years of education: Not on file  . Highest education level: Not on file  Occupational History  . Not on file  Tobacco Use  . Smoking status: Never Smoker  . Smokeless tobacco: Never Used  Substance and Sexual Activity  . Alcohol use: No  . Drug use: No  . Sexual activity: Never  Other Topics Concern  . Not on file  Social History Narrative  . Not on file   Social Determinants of Health   Financial Resource Strain: Not on file  Food Insecurity: Not on file  Transportation Needs: Not on file  Physical Activity: Not on file  Stress: Not on file  Social Connections: Not on file   Additional Social History:                         Sleep: Good  Appetite:  Good  Current Medications: Current Facility-Administered Medications  Medication Dose Route Frequency Provider Last Rate Last Admin  . acetaminophen (TYLENOL) tablet 650 mg  650 mg Oral Q6H PRN [name], MD   650 mg at 04/11/20 0122  . alum & mag hydroxide-simeth (MAALOX/MYLANTA)  200-200-20 MG/5ML suspension 30 mL  30 mL Oral Q4H PRN [name], MD      . hydrOXYzine (ATARAX/VISTARIL) tablet 25 mg  25 mg Oral TID PRN [name], MD      . OLANZapine zydis (ZYPREXA) disintegrating tablet 5 mg  5 mg Oral Q8H PRN Harlow Asa, MD       And  . LORazepam (ATIVAN) tablet 1 mg  1 mg Oral PRN Nelda Marseille, Amy E, MD       And  . ziprasidone (GEODON) injection 20 mg  20 mg Intramuscular PRN Nelda Marseille, Amy E, MD      . magnesium hydroxide (MILK OF MAGNESIA) suspension 30 mL  30 mL Oral Daily PRN [name], MD   30 mL at 04/14/20 0827  . OLANZapine zydis (ZYPREXA) disintegrating tablet 5 mg  5 mg Oral BID [name], MD      . traZODone (DESYREL) tablet 50 mg  50 mg Oral QHS PRN [name], MD   50 mg at 04/11/20 0122    Lab Results: No results found for this or any previous visit (from the past 48 hour(s)).  Blood Alcohol level:  Lab Results  Component Value Date   ETH <10 21/22/4825    Metabolic Disorder Labs: Lab Results  Component Value Date   HGBA1C 5.3 04/09/2020   MPG 105.41 04/09/2020   No results found for: PROLACTIN Lab Results  Component Value Date   CHOL 238 (H) 04/09/2020   TRIG 54 04/09/2020   HDL 93 04/09/2020   CHOLHDL 2.6 04/09/2020   VLDL 11 04/09/2020   LDLCALC 134 (H) 04/09/2020    Physical Findings: AIMS: Facial and Oral Movements Muscles of Facial Expression: None, normal Lips and Perioral Area: None, normal Jaw: None, normal Tongue: None, normal,Extremity Movements Upper (arms, wrists, hands, fingers): None, normal Lower (legs, knees, ankles, toes): None, normal, Trunk Movements Neck, shoulders, hips: None, normal, Overall Severity Severity of abnormal movements (highest score from questions above): None, normal Incapacitation due to abnormal movements: None, normal Patient's awareness of abnormal movements (rate only patient's report): No Awareness, Dental Status Current problems with  teeth and/or dentures?: No Does patient usually wear dentures?: No  CIWA:    COWS:     Musculoskeletal: Strength & Muscle Tone: within normal limits Gait & Station: normal Patient leans: N/A  Psychiatric Specialty Exam:  Presentation  General Appearance: Appropriate for Environment; Casual; Fairly Groomed  Eye Contact:Fair  Speech:Clear and Coherent; Normal Rate  Speech Volume:Normal  Handedness:No data recorded  Mood and Affect  Mood:Euthymic  Affect:Congruent; Appropriate   Thought Process  Thought Processes:Coherent; Goal Directed  Descriptions of Associations:Intact  Orientation:Full (Time, Place and Person)  Thought Content:Logical  Some religious focus  History of Schizophrenia/Schizoaffective disorder:No  Duration of Psychotic Symptoms:Less than six months  Hallucinations:Hallucinations: None  Ideas of Reference:None  Suicidal Thoughts:Suicidal Thoughts: No  Homicidal Thoughts:Homicidal Thoughts: No   Sensorium  Memory:Immediate Good; Recent Good; Remote Good  Judgment:Fair  Insight:Fair   Executive Functions  Concentration:Good  Attention Span:Good  Recall:Good  Fund of Knowledge:Good  Language:Good   Psychomotor Activity  Psychomotor Activity:Psychomotor Activity: Normal   Assets  Assets:Desire for Improvement; Armed forces logistics/support/administrative officer; Physical Health; Resilience; Social Support   Sleep  Sleep:Sleep: Good    Physical Exam: Physical Exam Vitals and nursing note reviewed.  HENT:     Head: Normocephalic and atraumatic.  Pulmonary:     Effort: Pulmonary effort is normal.  Neurological:     General: No focal deficit present.     Mental Status: She is alert and oriented to person, place, and time.    Review of Systems  Constitutional: Negative for chills, diaphoresis and fever.  HENT: Negative for sore throat.   Eyes: Negative for blurred vision.  Respiratory: Negative for cough and shortness of breath.    Cardiovascular: Negative for chest pain and palpitations.  Gastrointestinal: Negative for abdominal pain, constipation, diarrhea, nausea and vomiting.  Genitourinary: Negative for dysuria.  Musculoskeletal: Negative for joint pain and myalgias.  Skin: Negative for rash.  Neurological: Negative for dizziness and headaches.  Endo/Heme/Allergies: Does not bruise/bleed easily.  Psychiatric/Behavioral: Negative for depression, hallucinations and suicidal ideas. The patient is not nervous/anxious and does not have insomnia.    Blood pressure 112/79, pulse 74, temperature 97.6 F (36.4 C), temperature source Oral, resp. rate 17, height _0  (1.549 m), weight 49 kg, SpO2 100 %. Body mass index is 20.41 kg/m.   Treatment Plan Summary: 20 year old female with no prior past psychiatric history admitted for presumed manic and psychotic symptoms.  Since admission the patient has been calmer and less overtly symptomatic but she has continued to display some intermittent mood lability as well as some increased religious focus.  Thought processes are coherent and goal-directed. Over the weekend the patient consistently declined to take any standing dose olanzapine.  At this time she does not meet criteria for forced medications. Mood symptoms were likely exacerbated by substance use prior to admission.  Daily contact with patient to assess and evaluate symptoms and progress in treatment and Medication management   Continue every 15-minute observation status for safety of patient  Unspecified mood disorder: - Continue Zyprexa 5 mg BID.  Patient is refusing to take medications at this time. -The patient does not currently meet criteria for forced medication.  A second opinion was obtained regarding forced medication today and the second physician was in agreement that patient does not meet criteria for forced medication at this time.  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  Patient stated today that she can reside with her mother after discharge.  Social work is attempting to clarify housing plan post discharge.  Patient will also need psychiatric aftercare follow-up appointments.   [name], MD 04/15/2020, 4:07 PM

## 2020-04-15 NOTE — Progress Notes (Signed)
D: Pt presents with sad effect mild agitation which progressed to moderate agitation. Pt irritable, and crying. Pt pacing, writing notes, taking names, becoming increasingly negative.Pt refuses group. Pt denies SI/HI and AVH.  A: Labs/Vitals monitored; Medication and education refused; Pt supported emotionally and comforted; Pt asked to communicate needs, concerns, and questions; Pt refuses support.  R:  Pt remains safe with 15 minute checks; Will continue POC.
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
## 2020-04-15 NOTE — Progress Notes (Signed)
Recreation Therapy Notes  Date:  4.4.22 Time: 0930 Location: [address] Dayroom  Group Topic: Stress Management  Goal Area(s) Addresses:  Patient will identify positive stress management techniques. Patient will identify benefits of using stress management post d/c.  Intervention: Stress Management  Activity: Meditation.  LRT played a meditation that focused on calming anxiety.  Patients were to listen and follow along with the meditation to help overcome anxious thoughts and physical symptoms    Education:  Stress Management, Discharge Planning.   Education Outcome: Acknowledges Education  Clinical Observations/Feedback: Pt did not attend group.      Caroll Rancher, LRT/CTRS         Lillia Abed, Jawad Wiacek A 04/15/2020 11:03 AM

## 2020-04-16 MED ORDER — OLANZAPINE 5 MG PO TBDP: 5.0000 mg | ORAL_TABLET | Freq: Two times a day (BID) | ORAL | Status: DC

## 2020-04-16 NOTE — Progress Notes (Signed)
Pt was visible in the milieu but refused medication on shift. She seemed to sleep well through out the night.
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
## 2020-04-16 NOTE — Plan of Care (Signed)
  Problem: Activity: Goal: Interest or engagement in activities will improve Outcome: Progressing Goal: Sleeping patterns will improve Outcome: Progressing   Problem: Coping: Goal: Ability to verbalize frustrations and anger appropriately will improve Outcome: Progressing   

## 2020-04-16 NOTE — Progress Notes (Signed)
  [facility] Adult Case Management Discharge Plan :  Will you be returning to the same living situation after discharge:  Yes,  with mother  At discharge, do you have transportation home?: Yes,  Safe Transport  Do you have the ability to pay for your medications: Yes,  Medicaid   Release of information consent forms completed and in the chart;  Patient's signature needed at discharge.  Patient to Follow up at:  Follow-up Information    [facility]. Go on 04/22/2020.   Specialty: Behavioral Health Why: You have a walk in appointment on 04/22/20 at 7:45 am for therapy services.  You also have an appointment for medication management on 05/08/20 at 7:45 am.  Walk in appointments are first come, first served and are held in person.  Contact information: [address] [place] [address] [phone]              Next level of care provider has access to [facility] Link:yes  Safety Planning and Suicide Prevention discussed: Yes,  with patient and mother   Have you used any form of tobacco in the last 30 days? (Cigarettes, Smokeless Tobacco, Cigars, and/or Pipes): No  Has patient been referred to the Quitline?: N/A patient is not a smoker  Patient has been referred for addiction treatment: Pt. refused referral  Aram Beecham, LCSWA 04/16/2020, 9:14 AM

## 2020-04-16 NOTE — [facility] Suicide Risk Assessment (Signed)
[facility] Discharge Suicide Risk Assessment   Principal Problem: Unspecified mood (affective) disorder (HCC) Discharge Diagnoses: Principal Problem:   Unspecified mood (affective) disorder (HCC)   Total Time spent with patient: 20 minutes  Musculoskeletal: Strength & Muscle Tone: within normal limits Gait & Station: normal Patient leans: N/A  Psychiatric Specialty Exam: Review of Systems  Constitutional: Negative for chills, diaphoresis and fever.  HENT: Negative for sneezing and sore throat.   Eyes: Negative for discharge.  Respiratory: Negative for cough and shortness of breath.   Cardiovascular: Negative for chest pain.  Gastrointestinal: Negative for constipation, diarrhea, nausea and vomiting.  Genitourinary: Negative for difficulty urinating.  Musculoskeletal: Negative for arthralgias and myalgias.  Neurological: Negative for dizziness, tremors, light-headedness and headaches.  Hematological: Does not bruise/bleed easily.  Psychiatric/Behavioral: Negative for agitation, behavioral problems, dysphoric mood, hallucinations, self-injury, sleep disturbance and suicidal ideas. The patient is not nervous/anxious.     Blood pressure 112/79, pulse 74, temperature 97.6 F (36.4 C), temperature source Oral, resp. rate 17, height 5\' 1"  (1.549 m), weight 49 kg, SpO2 100 %.Body mass index is 20.41 kg/m.  General Appearance: Casual and Fairly Groomed  Eye Contact::  Good  Speech:  Clear and Coherent and Normal Rate409  Volume:  Normal  Mood:  Euthymic  Affect:  Appropriate and Congruent  Thought Process:  Coherent, Goal Directed and Linear  Orientation:  Full (Time, Place, and Person)  Thought Content:  Logical and no paranoid ideation, no referential thinking and no delusional content  Suicidal Thoughts:  No  Homicidal Thoughts:  No  Memory:  Immediate;   Good Recent;   Good Remote;   Good  Judgement:  Fair  Insight:  Fair  Psychomotor Activity:  Normal  Concentration:  Good   Recall:  Good  Fund of Knowledge:Good  Language: Good  Akathisia:  No    AIMS (if indicated):     Assets:  [id number] Housing Leisure Time Physical Health Resilience Social Support  Sleep:  Number of Hours: 6  Cognition: WNL  ADL's:  Intact   Mental Status Per Nursing Assessment::   On Admission:  NA  Demographic Factors:  Adolescent or young adult and Gay, lesbian, or bisexual orientation  Loss Factors: Legal issues  Historical Factors: Family history of suicide and Family history of mental illness or substance abuse  Risk Reduction Factors:   Living with another person, especially a relative, Positive social support and Positive coping skills or problem solving skills  Continued Clinical Symptoms:  Mood lability  Cognitive Features That Contribute To Risk:  None    Suicide Risk:  Minimal: No identifiable suicidal ideation.  Patients presenting with no risk factors but with morbid ruminations; may be classified as minimal risk based on the severity of the depressive symptoms   Follow-up Information    [facility]. Go on 04/22/2020.   Specialty: Behavioral Health Why: You have a walk in appointment on 04/22/20 at 7:45 am for therapy services.  You also have an appointment for medication management on 05/08/20 at 7:45 am.  Walk in appointments are first come, first served and are held in person.  Contact information: [phone] [place] [phone]              Plan Of Care/Follow-up recommendations:  Activity:  as tolerated Other:  take medications as prescribed.  Keep follow up appointments with outpatient therapist and outpatient psychiatrist.  Do not drink alcohol.  Do not use marijuana  and do not use other drugs.  Claudie Revering, MD 04/16/2020, 9:23 AM
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
## 2020-04-16 NOTE — Plan of Care (Addendum)
Discharge note  Patient verbalizes readiness for discharge. Follow up plan explained, AVS, Transition record and SRA given. Prescriptions and teaching provided. Belongings returned and signed for. Suicide safety plan completed and signed. Patient verbalizes understanding. Patient denies SI/HI and assures this Clinical research associate they will seek assistance should that change. Patient discharged to lobby to wait for Safe transport.  Problem: Safety: Goal: Violent Restraint(s) Outcome: Adequate for Discharge   Problem: Education: Goal: Knowledge of [facility] Education information/materials will improve Outcome: Adequate for Discharge Goal: Emotional status will improve Outcome: Adequate for Discharge Goal: Mental status will improve Outcome: Adequate for Discharge Goal: Verbalization of understanding the information provided will improve Outcome: Adequate for Discharge   Problem: Activity: Goal: Interest or engagement in activities will improve 04/16/2020 1123 by Raylene Miyamoto, RN Outcome: Adequate for Discharge 04/16/2020 [id number] by Raylene Miyamoto, RN Outcome: Progressing Goal: Sleeping patterns will improve 04/16/2020 1123 by Raylene Miyamoto, RN Outcome: Adequate for Discharge 04/16/2020 [id number] by Raylene Miyamoto, RN Outcome: Progressing   Problem: Coping: Goal: Ability to verbalize frustrations and anger appropriately will improve 04/16/2020 1123 by Raylene Miyamoto, RN Outcome: Adequate for Discharge 04/16/2020 [id number] by Raylene Miyamoto, RN Outcome: Progressing Goal: Ability to demonstrate self-control will improve Outcome: Adequate for Discharge   Problem: Health Behavior/Discharge Planning: Goal: Identification of resources available to assist in meeting health care needs will improve Outcome: Adequate for Discharge Goal: Compliance with treatment plan for underlying cause of condition will improve Outcome: Adequate for Discharge   Problem: Physical Regulation: Goal:  Ability to maintain clinical measurements within normal limits will improve Outcome: Adequate for Discharge   Problem: Safety: Goal: Periods of time without injury will increase Outcome: Adequate for Discharge   Problem: Activity: Goal: Will verbalize the importance of balancing activity with adequate rest periods Outcome: Adequate for Discharge   Problem: Education: Goal: Will be free of psychotic symptoms Outcome: Adequate for Discharge Goal: Knowledge of the prescribed therapeutic regimen will improve Outcome: Adequate for Discharge   Problem: Coping: Goal: Coping ability will improve Outcome: Adequate for Discharge Goal: Will verbalize feelings Outcome: Adequate for Discharge   Problem: Health Behavior/Discharge Planning: Goal: Compliance with prescribed medication regimen will improve Outcome: Adequate for Discharge   Problem: Nutritional: Goal: Ability to achieve adequate nutritional intake will improve Outcome: Adequate for Discharge   Problem: Role Relationship: Goal: Ability to communicate needs accurately will improve Outcome: Adequate for Discharge Goal: Ability to interact with others will improve Outcome: Adequate for Discharge   Problem: Safety: Goal: Ability to redirect hostility and anger into socially appropriate behaviors will improve Outcome: Adequate for Discharge Goal: Ability to remain free from injury will improve Outcome: Adequate for Discharge   Problem: Self-Care: Goal: Ability to participate in self-care as condition permits will improve Outcome: Adequate for Discharge   Problem: Self-Concept: Goal: Will verbalize positive feelings about self Outcome: Adequate for Discharge   Problem: Education: Goal: Ability to state activities that reduce stress will improve Outcome: Adequate for Discharge   Problem: Coping: Goal: Ability to identify and develop effective coping behavior will improve Outcome: Adequate for Discharge   Problem:  Self-Concept: Goal: Ability to identify factors that promote anxiety will improve Outcome: Adequate for Discharge Goal: Level of anxiety will decrease Outcome: Adequate for Discharge Goal: Ability to modify response to factors that promote anxiety will improve Outcome: Adequate for Discharge

## 2020-06-17 ENCOUNTER — Emergency Department (HOSPITAL_COMMUNITY)
Admission: EM | Admit: 2020-06-17 | Discharge: 2020-06-17 | Disposition: A | Discharge status: Home / Self Care | Payer: Medicaid Other | Attending: Emergency Medicine | Admitting: Emergency Medicine

## 2020-06-17 ENCOUNTER — Encounter (HOSPITAL_COMMUNITY): Payer: Self-pay | Admitting: Emergency Medicine

## 2020-06-17 ENCOUNTER — Other Ambulatory Visit: Payer: Self-pay

## 2020-06-17 ENCOUNTER — Emergency Department (HOSPITAL_COMMUNITY)
Admission: EM | Admit: 2020-06-17 | Discharge: 2020-06-18 | Disposition: A | Discharge status: Home / Self Care | Payer: Medicaid Other | Source: Home / Self Care | Attending: Emergency Medicine | Admitting: Emergency Medicine

## 2020-06-17 DIAGNOSIS — R45851 Suicidal ideations: Secondary | ICD-10-CM

## 2020-06-17 DIAGNOSIS — Z20822 Contact with and (suspected) exposure to covid-19: Secondary | ICD-10-CM | POA: Insufficient documentation

## 2020-06-17 DIAGNOSIS — F39 Unspecified mood [affective] disorder: Secondary | ICD-10-CM | POA: Diagnosis present

## 2020-06-17 DIAGNOSIS — Z008 Encounter for other general examination: Secondary | ICD-10-CM

## 2020-06-17 DIAGNOSIS — Z046 Encounter for general psychiatric examination, requested by authority: Secondary | ICD-10-CM | POA: Insufficient documentation

## 2020-06-17 DIAGNOSIS — F17210 Nicotine dependence, cigarettes, uncomplicated: Secondary | ICD-10-CM | POA: Insufficient documentation

## 2020-06-17 MED ORDER — LORAZEPAM 1 MG PO TABS
2.0000 mg | ORAL_TABLET | Freq: Once | ORAL | Status: DC
  Filled 2020-06-17: qty 2

## 2020-06-17 MED ORDER — LORAZEPAM 2 MG/ML IJ SOLN: 1.0000 mg | Freq: Once | INTRAMUSCULAR | Status: DC

## 2020-06-17 NOTE — BH Assessment (Signed)
Patient is rude and uncooperative during TTS  assessment . Patient refused to answer and questions but denies SI/ HI /AVH or substance use disposition to follow shortly
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
## 2020-06-17 NOTE — ED Triage Notes (Signed)
Patient complaining of hurting herself without a plan. Patient states that she has had a lot going on.
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
## 2020-06-17 NOTE — BH Assessment (Signed)
DISPOSITION: Gave clinical report to Dorena Bodo, NP  who determined Pt does not  meet criteria for inpatient psychiatric treatment.  Notified Dr. Linwood Dibbles, MD  and Sherian Rein , RN of disposition recommendation and the sitter utilization recommendation.
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
## 2020-06-17 NOTE — ED Notes (Addendum)
Pt says she was playing basketball on the street, she did not assault anyone and doesn't know why she is here.

## 2020-06-18 LAB — RESP PANEL BY RT-PCR (FLU A&B, COVID) ARPGX2
Influenza A by PCR: NEGATIVE
Influenza B by PCR: NEGATIVE
SARS Coronavirus 2 by RT PCR: NEGATIVE

## 2020-06-18 MED ORDER — NICOTINE 21 MG/24HR TD PT24: 21.0000 mg | MEDICATED_PATCH | Freq: Every day | TRANSDERMAL | Status: DC

## 2020-06-18 MED ORDER — ALUM & MAG HYDROXIDE-SIMETH 200-200-20 MG/5ML PO SUSP: 30.0000 mL | Freq: Four times a day (QID) | ORAL | Status: DC | PRN

## 2020-06-18 MED ORDER — ACETAMINOPHEN 325 MG PO TABS: 650.0000 mg | ORAL_TABLET | ORAL | Status: DC | PRN

## 2020-06-18 MED ORDER — ZOLPIDEM TARTRATE 5 MG PO TABS: 5.0000 mg | ORAL_TABLET | Freq: Every evening | ORAL | Status: DC | PRN

## 2020-06-18 NOTE — BH Assessment (Addendum)
Comprehensive Clinical Assessment (CCA) Note  06/18/2020 Kiara [name] [id number]   Disposition: Kiara Abtsody Taylor, PA-C recommends pt to be observed and reassessed by psychiatry. Pt to remain in the ED due to [facility] at capacity and pt's history of aggressive behaviors. Disposition discussed with Kiara AndersonSamantha R Petrucelli, PA-C and Kiara GheeJeneen N Nash, RN via secure chat in [employer].   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 Acute risk factors for suicide include: family or marital conflict and unemployment. Protective factors for this patient include: None. Considering these factors, the overall suicide risk at this point appears to be low. Patient is appropriate for outpatient follow up.  Kiara [name] is a 20 year old female who presents voluntary and unaccompanied to Kiara [name]. Pt was a poor historian, pt provided minimal information during the assessment and at times did not reponse to questions. Clinician asked the pt, "what brought you to the hospital?" Pt reports, she's been homeless for months, prior to being  homeless she was living in [place]. Clinician attempted to ask additional questions about the pt's living situation before being homeless however pt replied, "I don't understand all these questions." Clinician expressed when a assessment is consulted there are various questions that are asked in order the determine the most appropriate level of care based on a pt's  presentation. Pt reports she was suicidal with a plan. Clinician asked the pt what was her plan. Pt started talking about a business. Clinician clarified to the pt, if she had a suicide plan, pt replied, "I don't know." Pt denies, previous suicide attempts. Pt denies, SI, HI, AVH, self-injurious behaviors and access to weapons.   Pt reports, not using marijuana recently. Pt's UDS is pending. Pt denies, being linked to OPT resources (medication management and/or counseling.) Pt [facility]/2020. Pt report [facility] but is not taking it.   Pt presents quiet, awake with soft speech (clinician had to asked the pt to repeat herself at times.) Pt's mood was irritable. Pt's affect was negative, flat. Pt's thought content was appropriate to mood and circumstances. Pt's insight was poor. Pt's judgement was fair. Pt reports, if discharged he wouldn't hurt nobody, she just wants a place to stay and a car. Pt also reports, she doesn't want to die she wants her money.   Diagnosis: Unspecified Mood (Affective) Disorder (HCC).  *Pt denies, having supports,*   Chief Complaint:  Chief Complaint  Patient presents with  . Suicidal   Visit Diagnosis:     CCA Screening, Triage and Referral (STR)  Patient Reported Information How did you hear about us? Self  Referral name: No data recorded Referral phone number: No data recorded  Whom do you see for routine medical problems? Other (Comment) (UTA)  Practice/Facility Name: No data recorded Practice/Facility Phone Number: No data recorded Name of Contact: No data recorded Contact Number: No data recorded Contact Fax Number: No data recorded Prescriber Name: No data recorded Prescriber Address (if  known): No data recorded  What Is the Reason for Your Visit/Call Today? Homelessness, wanting her money and a car. Pt mentioned suicidal ideations but denied wanting to  die.  How Long Has This Been Causing You Problems? No data recorded What Do You Feel Would Help You the Most Today? Housing Assistance; Transportation Assistance   Have You Recently Been in Any Inpatient Treatment (Hospital/Detox/Crisis Center/28-Day Program)? No data recorded Name/Location of Program/Hospital:No data recorded How Long Were You There? No data recorded When Were You Discharged? No data recorded  Have You Ever Received Services From [facility] Before? Yes  Who Do You See at [facility]? Pt has previous ED and [facility] admissions.   Have You Recently Had Any Thoughts About Hurting Yourself? Yes  Are You Planning to Commit Suicide/Harm Yourself At This time? No   Have you Recently Had Thoughts About Hurting Someone [name]? No  Explanation: No data recorded  Have You Used Any Alcohol or Drugs in the Past 24 Hours? No (Pt reports, smoking marijuana but not recently.)  How Long Ago Did You Use Drugs or Alcohol? No data recorded What Did You Use and How Much? No data recorded  Do You Currently Have a Therapist/Psychiatrist? No  Name of Therapist/Psychiatrist: No data recorded  Have You Been Recently Discharged From Any Office Practice or Programs? No data recorded Explanation of Discharge From Practice/Program: No data recorded    CCA Screening Triage Referral Assessment Type of Contact: Tele-Assessment  Is this Initial or Reassessment? Initial Assessment  Date Telepsych consult ordered in [facility]:  06/18/2020  Time Telepsych consult ordered in [facility]:  2220   Patient Reported Information Reviewed? Yes  Patient Left Without Being Seen? No data recorded Reason for Not Completing Assessment: No data recorded  Collateral Involvement: Initally pt did not respond if pt had collaterals or supports. Pt reports, having no supports.   Does Patient Have a Automotive engineer Guardian? No data recorded Name and Contact of Legal Guardian: No data recorded If Minor and  Not Living with Parent(s), Who has Custody? No data recorded Is CPS involved or ever been involved? -- (UTA)  Is APS involved or ever been involved? -- (UTA)   Patient Determined To Be At Risk for Harm To Self or Others Based on Review of Patient Reported Information or Presenting Complaint? No data recorded Method: No data recorded Availability of Means: No data recorded Intent: No data recorded Notification Required: No data recorded Additional Information for Danger to Others Potential: No data recorded Additional Comments for Danger to Others Potential: No data recorded Are There Guns or Other Weapons in Your Home? No data recorded Types of Guns/Weapons: No data recorded Are These Weapons Safely Secured?                            No data recorded Who Could Verify You Are Able To Have These Secured: No data recorded Do You Have any Outstanding Charges, Pending Court Dates, Parole/Probation? No data recorded Contacted To Inform of Risk of Harm To Self or Others: No data recorded  Location of Assessment: [facility]   Does Patient Present under Involuntary Commitment? No  IVC Papers Initial File Date: No data recorded  [place] of Residence: [place]   Patient Currently Receiving the Following Services: Not Receiving Services   Determination of Need: Urgent (48 hours)   Options For Referral: Medication Management; Inpatient Hospitalization; [facility]; Outpatient Therapy  CCA Biopsychosocial Intake/Chief Complaint:  Per EDP/PA note: "is a 20 y.o. female with a hx of tobacco abuse and mood disorder who presents to the ED with complaints of SI recently. Patient states she has been having a hard time recently, having thoughts of harming herself/suicide, no plan/attempt. No specific alleviating/aggravating factors that she reports.Denies HI or hallucinations.Denies pain, fever, or dyspnea."  Current Symptoms/Problems: Pt provided minimal information during the assessment and  at times did not reponse to questions.   Patient Reported Schizophrenia/Schizoaffective Diagnosis in Past: No   Strengths: NA  Preferences: NA  Abilities: NA   Type of Services Patient Feels are Needed: Pt reports, if discharged she will not hurt herself she want a place to stay and a car.   Initial Clinical Notes/Concerns: NA   Mental Health Symptoms Depression:  Irritability; Difficulty Concentrating; Tearfulness   Duration of Depressive symptoms: No data recorded  Mania:  Irritability   Anxiety:   Worrying   Psychosis:  None   Duration of Psychotic symptoms: Less than six months   Trauma:  -- (UTA)   Obsessions:  -- (UTA)   Compulsions:  -- (UTA)   Inattention:  -- (UTA)   Hyperactivity/Impulsivity:  -- (UTA)   Oppositional/Defiant Behaviors:  -- (UTA)   Emotional Irregularity:  -- (UTA)   Other Mood/Personality Symptoms:  No data recorded   Mental Status Exam Appearance and self-care  Stature:  Average   Weight:  Average weight   Clothing:  Casual   Grooming:  Neglected   Cosmetic use:  None   Posture/gait:  Slumped   Motor activity:  Agitated   Sensorium  Attention:  Inattentive   Concentration:  Preoccupied   Orientation:  X5   Recall/memory:  Normal   Affect and Mood  Affect:  Negative; Flat   Mood:  Irritable   Relating  Eye contact:  None   Facial expression:  Constricted; Angry   Attitude toward examiner:  Resistant; Irritable; Argumentative   Thought and Language  Speech flow: Soft   Thought content:  Appropriate to Mood and Circumstances   Preoccupation:  None   Hallucinations:  None   Organization:  No data recorded  [employer] of Knowledge:  Poor   Intelligence:  Average   Abstraction:  Normal   Judgement:  Fair   Dance movement psychotherapist:  Distorted   Insight:  Fair   Decision Making:  Impulsive   Social Functioning  Social Maturity:  Self-centered   Social Judgement:  "Chief of Staff"    Stress  Stressors:  Housing; Office manager Ability:  Deficient supports   Skill Deficits:  Building services engineer   Supports:  Support needed     Religion: Religion/Spirituality Are You A Religious Person?:  (UTA) How Might This Affect Treatment?: UTA  Leisure/Recreation: Leisure / Recreation Do You Have Hobbies?:  (UTA)  Exercise/Diet: Exercise/Diet Do You Exercise?:  (UTA) Do You Follow a Special Diet?:  (UTA) Do You Have Any Trouble Sleeping?:  (UTA)   CCA Employment/Education Employment/Work Situation: Employment / Work Situation Employment situation: Unemployed Where is patient currently employed?: NA How long has patient been employed?: NA What is the longest time patient has a held a job?: UTA Where was the patient employed at that time?: UTA Has patient ever been in the [employer]?: No  Education: Education Is Patient Currently Attending School?: No Did Garment/textile technologist From [employer]?: Yes Did Theme park manager?: No Did Designer, television/film set?: No   CCA  Family/Childhood History Family and Relationship History: Family history Marital status: Single What is your sexual orientation?: UTA Has your sexual activity been affected by drugs, alcohol, medication, or emotional stress?: UTA Does patient have children?: No  Childhood History:  Childhood History By whom was/is the patient raised?:  (UTA) Additional childhood history information: UTA Description of patient's relationship with caregiver when they were a child: UTA Patient's description of current relationship with people who raised him/her: UTA How were you disciplined when you got in trouble as a child/adolescent?: UTA Does patient have siblings?: Yes Number of Siblings:  (UTA) Description of patient's current relationship with siblings: UTA Did patient suffer any verbal/emotional/physical/sexual abuse as a child?:  (UTA) Did patient suffer from severe childhood neglect?:   (UTA) Has patient ever been sexually abused/assaulted/raped as an adolescent or adult?:  (UTA) Was the patient ever a victim of a crime or a disaster?:  (UTA) Witnessed domestic violence?:  (UTA)  Child/Adolescent Assessment:     CCA Substance Use Alcohol/Drug Use: Alcohol / Drug Use Pain Medications: See MAR Prescriptions: See MAR Over the Counter: See MAR History of alcohol / drug use?: Yes   ASAM's:  Six Dimensions of Multidimensional Assessment  Dimension 1:  Acute Intoxication and/or Withdrawal Potential:      Dimension 2:  Biomedical Conditions and Complications:      Dimension 3:  Emotional, Behavioral, or Cognitive Conditions and Complications:     Dimension 4:  Readiness to Change:     Dimension 5:  Relapse, Continued use, or Continued Problem Potential:     Dimension 6:  Recovery/Living Environment:     ASAM Severity Score:    ASAM Recommended Level of Treatment:     Substance use Disorder (SUD)    Recommendations for Services/Supports/Treatments: Recommendations for Services/Supports/Treatments Recommendations For Services/Supports/Treatments: Other (Comment) (Pt to be observed and reassessed by psychiatry.)  DSM5 Diagnoses: Patient Active Problem List   Diagnosis Date Noted  . Unspecified mood (affective) disorder (HCC) 04/10/2020    Referrals to Alternative Service(s): Referred to Alternative Service(s):   Place:   Date:   Time:    Referred to Alternative Service(s):   Place:   Date:   Time:    Referred to Alternative Service(s):   Place:   Date:   Time:    Referred to Alternative Service(s):   Place:   Date:   Time:     Redmond Pulling, [facility]  Comprehensive Clinical Assessment (CCA) Screening, Triage and Referral Note  06/18/2020 Kiara [name] [id number]  Chief Complaint:  Chief Complaint  Patient presents with  . Suicidal   Visit Diagnosis:   Patient Reported Information How did you hear about [place]? Self   Referral name: No data  recorded  Referral phone number: No data recorded Whom do you see for routine medical problems? Other (Comment) (UTA)   Practice/Facility Name: No data recorded  Practice/Facility Phone Number: No data recorded  Name of Contact: No data recorded  Contact Number: No data recorded  Contact Fax Number: No data recorded  Prescriber Name: No data recorded  Prescriber Address (if known): No data recorded What Is the Reason for Your Visit/Call Today? Homelessness, wanting her money and a car. Pt mentioned suicidal ideations but denied wanting to die.  How Long Has This Been Causing You Problems? No data recorded Have You Recently Been in Any Inpatient Treatment (Hospital/Detox/Crisis Center/28-Day Program)? No data recorded  Name/Location of Program/Hospital:No data recorded  How Long Were You There? No data recorded  When Were You Discharged? No data recorded Have You Ever Received Services From [facility] Before? Yes   Who Do You See at [facility]? Pt has previous ED and [facility] admissions.  Have You Recently Had Any Thoughts About Hurting Yourself? Yes   Are You Planning to Commit Suicide/Harm Yourself At This time?  No  Have you Recently Had Thoughts About Hurting Someone [name]? No   Explanation: No data recorded Have You Used Any Alcohol or Drugs in the Past 24 Hours? No (Pt reports, smoking marijuana but not recently.)   How Long Ago Did You Use Drugs or Alcohol?  No data recorded  What Did You Use and How Much? No data recorded What Do You Feel Would Help You the Most Today? Housing Assistance; Transportation Assistance  Do You Currently Have a Therapist/Psychiatrist? No   Name of Therapist/Psychiatrist: No data recorded  Have You Been Recently Discharged From Any Public relations account executive or Programs? No data recorded  Explanation of Discharge From Practice/Program:  No data recorded    CCA Screening Triage Referral Assessment Type of Contact: Tele-Assessment   Is this Initial  or Reassessment? Initial Assessment   Date Telepsych consult ordered in [facility]:  06/18/2020   Time Telepsych consult ordered in [facility]:  2220  Patient Reported Information Reviewed? Yes   Patient Left Without Being Seen? No data recorded  Reason for Not Completing Assessment: No data recorded Collateral Involvement: Initally pt did not respond if pt had collaterals or supports. Pt reports, having no supports.  Does Patient Have a Automotive engineer Guardian? No data recorded  Name and Contact of Legal Guardian:  No data recorded If Minor and Not Living with Parent(s), Who has Custody? No data recorded Is CPS involved or ever been involved? -- (UTA)  Is APS involved or ever been involved? -- (UTA)  Patient Determined To Be At Risk for Harm To Self or Others Based on Review of Patient Reported Information or Presenting Complaint? No data recorded  Method: No data recorded  Availability of Means: No data recorded  Intent: No data recorded  Notification Required: No data recorded  Additional Information for Danger to Others Potential:  No data recorded  Additional Comments for Danger to Others Potential:  No data recorded  Are There Guns or Other Weapons in Your Home?  No data recorded   Types of Guns/Weapons: No data recorded   Are These Weapons Safely Secured?                              No data recorded   Who Could Verify You Are Able To Have These Secured:    No data recorded Do You Have any Outstanding Charges, Pending Court Dates, Parole/Probation? No data recorded Contacted To Inform of Risk of Harm To Self or Others: No data recorded Location of [facility]: [facility]  Does Patient Present under Involuntary Commitment? No   IVC Papers Initial File Date: No data recorded  [place] of Residence: [place]  Patient Currently Receiving the Following Services: Not Receiving Services   Determination of Need: Urgent (48 hours)   Options For Referral: Medication Management; Inpatient  Hospitalization; [facility] Urgent Care; Outpatient Therapy   Redmond Pulling, [facility]     Redmond Pulling, MS, [facility] Triage Specialist [phone]

## 2020-06-18 NOTE — ED Notes (Signed)
Unable to get blood pt told me to take it out even before the needle was in.

## 2020-06-18 NOTE — ED Notes (Signed)
Pt given burgundy scrubs and instructed to place their personal clothes in pt belonging bag.

## 2020-06-21 ENCOUNTER — Ambulatory Visit (HOSPITAL_COMMUNITY)
Admission: EM | Admit: 2020-06-21 | Discharge: 2020-06-21 | Disposition: A | Discharge status: Home / Self Care | Payer: Medicaid Other | Attending: Mental Health | Admitting: Mental Health

## 2020-06-21 ENCOUNTER — Other Ambulatory Visit: Payer: Self-pay

## 2020-06-21 DIAGNOSIS — R45851 Suicidal ideations: Secondary | ICD-10-CM | POA: Insufficient documentation

## 2020-06-21 DIAGNOSIS — F39 Unspecified mood [affective] disorder: Secondary | ICD-10-CM

## 2020-06-21 DIAGNOSIS — Z765 Malingerer [conscious simulation]: Secondary | ICD-10-CM

## 2020-06-21 NOTE — BH Assessment (Signed)
TTS triage: Patient presents to [facility] for evaluation. She states, "I want to kill myself." She denies any specific plan or intent. She denies HI/VH. She reports AH of voices but does not elaborate. Appears to be thought blocking.  Patient is urgent.

## 2020-06-21 NOTE — ED Provider Notes (Addendum)
Behavioral Health Urgent Care Medical Screening Exam  Patient Name: Kiara [name] MRN: [id number] Date of Evaluation: 06/21/20 Chief Complaint:   Diagnosis:  Final diagnoses:  None    History of Present illness: Kiara [name] is a 20 y.o. female. Patient presented with chief complaint of "I need crash here for the night." Patient report that she is unable to return to family home and would like to be admitted to [facility]. Patient denies psychiatric concerns.   Patient initially reported suicidal ideation to TTS counselor Celedonio Miyamoto however she denies SI to this Clinical research associate. She denies history of suicidal attempts and self-harming behavior. She contracts for safety. She is also denying HI, AVH, paranoia, and no delusional thought content noted. She endorses occasional marijuana use, she denies all other illicit drug use.    Patient is alert and orient, she is calm and cooperative. Patient denies any headache, nausea, vomiting, dizziness, chest pain, SOB, abdominal pain, diarrhea, and constipation.   Patient's behavior drastically changed once she was informed that she didn't meet admission criteria. Patient begin to talk to herself, laughing loudly, and reporting that she is seeing shadows. Patient previously denied AVH, paranoia, and delusion to  TTS counselor and this Clinical research associate.   Psychiatric Specialty Exam  Presentation  General Appearance:Appropriate for Environment  Eye Contact:Good  Speech:Clear and Coherent  Speech Volume:Normal  Handedness:Right   Mood and Affect  Mood:Euthymic  Affect:Congruent   Thought Process  Thought Processes:Coherent  Descriptions of Associations:Intact  Orientation:Full (Time, Place and Person)  Thought Content:WDL  Diagnosis of Schizophrenia or Schizoaffective disorder in past: No  Duration of Psychotic Symptoms: Less than six months  Hallucinations:None  Ideas of Reference:None  Suicidal Thoughts:No  Homicidal  Thoughts:No   Sensorium  Memory:Immediate Good; Recent Good; Remote Fair  Judgment:Fair  Insight:Fair   Executive Functions  Concentration:Good  Attention Span:Good  Recall:Good  Fund of Knowledge:Good  Language:Good   Psychomotor Activity  Psychomotor Activity:Normal   Assets  Assets:Communication Skills; Desire for Improvement; Physical Health   Sleep  Sleep:Good  Number of hours: 7   No data recorded  Physical Exam: Physical Exam Vitals and nursing note reviewed.  Constitutional:      General: She is not in acute distress.    Appearance: She is well-developed.  HENT:     Head: Normocephalic and atraumatic.  Eyes:     Conjunctiva/sclera: Conjunctivae normal.  Cardiovascular:     Rate and Rhythm: Normal rate and regular rhythm.     Heart sounds: No murmur heard. Pulmonary:     Effort: Pulmonary effort is normal. No respiratory distress.     Breath sounds: Normal breath sounds.  Abdominal:     Palpations: Abdomen is soft.     Tenderness: There is no abdominal tenderness.  Musculoskeletal:     Cervical back: Neck supple.  Skin:    General: Skin is warm and dry.  Neurological:     Mental Status: She is alert and oriented to person, place, and time.  Psychiatric:        Attention and Perception: Attention normal.        Mood and Affect: Mood and affect normal.        Speech: Speech normal.        Behavior: Behavior normal. Behavior is cooperative.        Thought Content: Thought content normal. Thought content is not paranoid or delusional. Thought content does not include homicidal or suicidal ideation. Thought content does not include homicidal or suicidal plan.  Review of Systems  Constitutional: Negative.   HENT: Negative.    Eyes: Negative.   Respiratory: Negative.    Cardiovascular: Negative.   Gastrointestinal: Negative.   Genitourinary: Negative.   Musculoskeletal: Negative.   Skin: Negative.   Neurological: Negative.    Endo/Heme/Allergies: Negative.   Psychiatric/Behavioral: Negative.    Blood pressure 108/78, pulse 69, temperature 98.3 F (36.8 C), temperature source Oral, resp. rate 18, height 5\' 3"  (1.6 m), weight 53.1 kg, last menstrual period 06/03/2020, SpO2 100 %. Body mass index is 20.73 kg/m.  Musculoskeletal: Strength & Muscle Tone: within normal limits Gait & Station: normal Patient leans: Right   [facility] MSE Discharge Disposition for Follow up and Recommendations: Based on my evaluation the patient does not appear to have an emergency medical condition and can be discharged with resources and follow up care in outpatient services for Medication Management and Individual Therapy  Patient was given out patient resources by TTS counselor.   06/05/2020, NP 06/21/2020, 10:26 PM

## 2020-06-21 NOTE — BH Assessment (Signed)
TTS triage:

## 2020-06-22 NOTE — BH Assessment (Addendum)
Comprehensive Clinical Assessment (CCA) Screening, Triage and Referral Note  06/22/2020 Kiara [name] [id number]  Disposition: Per Cecilio Asper, NP pt does not meet inpatient treatment criteria. Housing and OPT resources were provided to the pt.    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 Acute risk factors for suicide include: unemployment. Protective factors for this patient include:  None . Considering these factors, the overall suicide risk at this point appears to be low. Patient is appropriate for outpatient follow up.   Kiara [name] is a 20 year old female who presents voluntary and unaccompanied to [facility]. Pt was a poor historian during the assessment. Clinician asked the pt, "what brought you to the hospital?" Pt reported, "I want to commit suicide." Clinician asked the pt does she have a plan; pt replied, "I want to get shot." Clinician asked the pt if had intention of getting shot or shooting herself, pt replied, "no." Clinician asked the pt if she wanted to commit suicide by getting shot, pt looked at clinician and said she doesn't want to answer the question. Pt then asked clinician if you put her finger in your ear then in your vagina can you see if you have AIDS. Clinician expressed an AIDS test through blood can determine HIV/AIDS status. Clinician asked the pt, "what type of treatment are you seeking?" Pt replied, "to be  honest I don't know." Pt reported, she cut herself then looked at her arms, pt's arms have not cut marks, pt then reported she burned herself when she was in high school in 2020. Pt reports, she's homeless since she left her parents house (in [place]). Clinician asked the pt if she can return to her parents house, pt did not reply. Pt reported, "I just want to be a man." Clinician asked the pt if she preferred female pronouns, pt replied, "no." Pt denies, previous suicide attempts, HI, AVH, self-injurious behaviors and access to weapons.   Pt denies, being linked to OPT resources (medication management and/or counseling.) Pt denies, previous inpatient admissions.   Pt was assessed by clinician with a similar presentation on 06/18/2020 at [facility]; Melbourne Abts, PA-C recommended the pt to be observed and reassessed by psychiatry however the pt left the ED. Pt presents quiet, awake with soft speech. Pt's mood was euthymic. Pt's affect was congruent. Pt's insight and judgement was fair. Pt's thought content was circumstantial. Pt reports, if discharged she can contract for safety.   Diagnosis: Unspecified Mood (Affective) Disorder (HCC)   *Clinician asked the pt if she can contact family supports, pt looked at clinician and asked if someone comes and gets her can she leave, clinician asked the pt if she wanted to be assessed. Pt just looked at clinician and did not reply.*  Chief Complaint:  Chief Complaint  Patient presents with   Urgent Emergent Eval   Visit Diagnosis:   Patient Reported Information How did you hear about [place]? Self  What Is the  Reason for Your Visit/Call Today? "I want to kill myself"  How Long Has This Been Causing You Problems? > than 6 months  What Do You Feel Would Help You the Most Today? Treatment for Depression or other mood problem   Have You Recently Had Any Thoughts About Hurting Yourself? Yes  Are You Planning to Commit Suicide/Harm Yourself At This time?  No   Have you Recently Had Thoughts About Hurting Someone [name]? No  Are You Planning to Harm Someone at This Time? No  Explanation: No data recorded  Have You Used Any Alcohol or Drugs in the Past 24 Hours? Yes  How Long Ago Did You Use Drugs or Alcohol? No data recorded What Did You Use and How Much? weed   Do You Currently Have a Therapist/Psychiatrist? No  Name of Therapist/Psychiatrist: No data recorded  Have You Been Recently Discharged From Any Office Practice or Programs? No data recorded Explanation of Discharge From Practice/Program: No data recorded   CCA Screening Triage Referral Assessment Type of Contact: Tele-Assessment  Telemedicine Service Delivery:   Is this Initial or Reassessment? Initial Assessment  Date Telepsych consult ordered in [facility]:  06/18/20  Time Telepsych consult ordered in [facility]:  2220  Location of Assessment: [facility] ED  Provider Location: No data recorded  Collateral Involvement: Initally pt did not respond if pt had collaterals or supports. Pt reports, having no supports.   Does Patient Have a Automotive engineer Guardian? No data recorded Name and Contact of Legal Guardian: No data recorded If Minor and Not Living with Parent(s), Who has Custody? No data recorded Is CPS involved or ever been involved? -- (UTA)  Is APS involved or ever been involved? -- (UTA)   Patient Determined To Be At Risk for Harm To Self or Others Based on Review of Patient Reported Information or Presenting Complaint? No data recorded Method: No data recorded Availability of Means: No data recorded Intent: No data recorded Notification Required: No data recorded Additional Information for Danger to Others Potential: No data recorded Additional Comments for Danger to Others Potential: No data recorded Are There Guns or Other Weapons in Your Home? No data recorded Types of Guns/Weapons: No data recorded Are These Weapons Safely Secured?                             No data recorded Who Could Verify You Are Able To Have These Secured: No data recorded Do You Have any Outstanding Charges, Pending Court Dates, Parole/Probation? No data recorded Contacted To Inform of Risk of Harm To Self or Others: No data recorded  Does Patient Present under Involuntary Commitment? No  IVC Papers Initial File Date: No data recorded  [place] of Residence: [place]   Patient Currently Receiving the Following Services: Not Receiving Services   Determination of Need: Urgent (48 hours)   Options For Referral: [facility]   Discharge Disposition:     Redmond Pulling, [facility]    Comprehensive Clinical Assessment (CCA) Note  06/22/2020 Faithann Natal [id number]  Chief Complaint:  Chief Complaint  Patient presents with   Urgent Emergent Eval   Visit Diagnosis:    CCA Screening, Triage and Referral (STR)  Patient Reported Information How did you hear about [place]? Self  What Is the Reason for Your Visit/Call Today? "I want to kill myself"  How Long Has This Been Causing You Problems? > than 6 months  What Do You Feel  Would Help You the Most Today? Treatment for Depression or other mood problem   Have You Recently Had Any Thoughts About Hurting Yourself? Yes  Are You Planning to Commit Suicide/Harm Yourself At This time? No   Have you Recently Had Thoughts About Hurting Someone [name]? No  Are You Planning to Harm Someone at This Time? No  Explanation: No data recorded  Have You Used Any Alcohol or Drugs in the Past 24 Hours? Yes  How Long Ago Did You Use Drugs or Alcohol? No data recorded What Did You Use and How Much? weed   Do You Currently Have a Therapist/Psychiatrist? No  Name of Therapist/Psychiatrist: No data recorded  Have You Been Recently Discharged From Any Office Practice or Programs? No data recorded Explanation of Discharge From Practice/Program: No data recorded    CCA Screening Triage Referral Assessment Type of  Contact: Tele-Assessment  Telemedicine Service Delivery:   Is this Initial or Reassessment? Initial Assessment  Date Telepsych consult ordered in [facility]:  06/18/20  Time Telepsych consult ordered in [facility]:  2220  Location of Assessment: [facility]  Provider Location: No data recorded  Collateral Involvement: Initally pt did not respond if pt had collaterals or supports. Pt reports, having no supports.   Does Patient Have a Automotive engineer Guardian? No data recorded Name and Contact of Legal Guardian: No data recorded If Minor and Not Living with Parent(s), Who has Custody? No data recorded Is CPS involved or ever been involved? -- (UTA)  Is APS involved or ever been involved? -- (UTA)   Patient Determined To Be At Risk for Harm To Self or Others Based on Review of Patient Reported Information or Presenting Complaint? No data recorded Method: No data recorded Availability of Means: No data recorded Intent: No data recorded Notification Required: No data recorded Additional Information for Danger to Others Potential: No data recorded Additional Comments for Danger to Others Potential: No data recorded Are There Guns or Other Weapons in Your Home? No data recorded Types of Guns/Weapons: No data recorded Are These Weapons Safely Secured?                            No data recorded Who Could Verify You Are Able To Have These Secured: No data recorded Do You Have any Outstanding Charges, Pending Court Dates, Parole/Probation? No data recorded Contacted To Inform of Risk of Harm To Self or Others: No data recorded   Does Patient Present under Involuntary Commitment? No  IVC Papers Initial File Date: No data recorded  [place] of Residence: [place]   Patient Currently Receiving the Following Services: Not Receiving Services   Determination of Need: Urgent (48 hours)   Options For Referral: [facility]     CCA Biopsychosocial Patient Reported  Schizophrenia/Schizoaffective Diagnosis in Past: No   Strengths: Supports.   Mental Health Symptoms Depression:   Tearfulness; Worthlessness; Hopelessness   Duration of Depressive symptoms:    Mania:   Irritability   Anxiety:    Worrying   Psychosis:   None   Duration of Psychotic symptoms:    Trauma:   -- (UTA)   Obsessions:   -- (UTA)   Compulsions:   -- (UTA)   Inattention:   -- (UTA)   Hyperactivity/Impulsivity:   -- (UTA)   Oppositional/Defiant Behaviors:   -- (UTA)   Emotional Irregularity:   -- (UTA)   Other Mood/Personality Symptoms:  No data recorded  Mental Status Exam Appearance and self-care  Stature:   Average   Weight:   Average weight   Clothing:   Casual   Grooming:   Neglected   Cosmetic use:   None   Posture/gait:   Slumped   Motor activity:   Restless   Sensorium  Attention:   Inattentive   Concentration:   -- (Fair.)   Orientation:   X5   Recall/memory:   Defective in Immediate   Affect and Mood  Affect:   Congruent   Mood:   Euthymic   Relating  Eye contact:   -- (Fair.)   Facial expression:   Responsive   Attitude toward examiner:   Guarded   Thought and Language  Speech flow:  Soft   Thought content:   -- (Thought blocking.)   Preoccupation:   None   Hallucinations:   None   Organization:  No data recorded  [employer] of Knowledge:   Poor   Intelligence:   Average   Abstraction:   Normal   Judgement:   Fair   Dance movement psychotherapist:   Distorted   Insight:   Fair   Decision Making:   Impulsive   Social Functioning  Social Maturity:   Irresponsible   Social Judgement:   "Street Smart"   Stress  Stressors:   Housing; Office manager Ability:   Contractor Deficits:   Communication; Decision making   Supports:   Family     Religion: Religion/Spirituality Are You A Religious Person?:  (UTA) How Might This Affect Treatment?:  UTA  Leisure/Recreation: Leisure / Recreation Do You Have Hobbies?:  (UTA)  Exercise/Diet: Exercise/Diet Do You Exercise?:  (UTA) Do You Follow a Special Diet?:  (UTA) Do You Have Any Trouble Sleeping?:  (UTA)   CCA Employment/Education Employment/Work Situation: Employment / Work Situation Employment Situation: Unemployed Has Patient ever Been in Equities trader?: No  Education: Education Is Patient Currently Attending School?: No   CCA Family/Childhood History Family and Relationship History: Family history Marital status: Single Does patient have children?: No  Childhood History:  Childhood History By whom was/is the patient raised?:  (UTA) Description of patient's current relationship with siblings: UTA Did patient suffer any verbal/emotional/physical/sexual abuse as a child?:  (UTA) Did patient suffer from severe childhood neglect?:  (UTA) Has patient ever been sexually abused/assaulted/raped as an adolescent or adult?:  (UTA) Was the patient ever a victim of a crime or a disaster?:  (UTA) Witnessed domestic violence?:  (UTA)  Child/Adolescent Assessment:     CCA Substance Use Alcohol/Drug Use: Alcohol / Drug Use Pain Medications: See MAR Prescriptions: See MAR Over the Counter: See MAR History of alcohol / drug use?: Yes Substance #1 Name of Substance 1: Marijuana. 1 - Age of First Use: UTA 1 - Amount (size/oz): UTA 1 - Frequency: UTA 1 - Duration: UTA 1 - Last Use / Amount: UTA 1 - Method of Aquiring: UTA 1- Route of Use: Smoke.    ASAM's:  Six Dimensions of Multidimensional Assessment  Dimension 1:  Acute Intoxication and/or Withdrawal Potential:      Dimension 2:  Biomedical Conditions and Complications:      Dimension 3:  Emotional, Behavioral, or Cognitive Conditions and Complications:     Dimension 4:  Readiness to Change:     Dimension 5:  Relapse, Continued use, or Continued Problem Potential:     Dimension 6:  Recovery/Living  Environment:     ASAM Severity Score:  ASAM Recommended Level of Treatment:     Substance use Disorder (SUD)    Recommendations for Services/Supports/Treatments: Recommendations for Services/Supports/Treatments Recommendations For Services/Supports/Treatments: Other (Comment), Inpatient Hospitalization, Medication Management (Housing resources.)  Discharge Disposition:    DSM5 Diagnoses: Patient Active Problem List   Diagnosis Date Noted   Unspecified mood (affective) disorder (HCC) 04/10/2020     Referrals to Alternative Service(s): Referred to Alternative Service(s):   Place:   Date:   Time:    Referred to Alternative Service(s):   Place:   Date:   Time:    Referred to Alternative Service(s):   Place:   Date:   Time:    Referred to Alternative Service(s):   Place:   Date:   Time:     Redmond Pullingreylese D Vickie Melnik, [facility]    Redmond Pullingreylese D Florinda Taflinger, MS, [facility], [facility] Triage Specialist [phone]

## 2020-06-25 ENCOUNTER — Emergency Department (HOSPITAL_COMMUNITY)
Admission: EM | Admit: 2020-06-25 | Discharge: 2020-06-26 | Disposition: A | Discharge status: Home / Self Care | Payer: Medicaid Other | Attending: Emergency Medicine | Admitting: Emergency Medicine

## 2020-06-25 DIAGNOSIS — R45851 Suicidal ideations: Secondary | ICD-10-CM | POA: Diagnosis not present

## 2020-06-25 DIAGNOSIS — Z20822 Contact with and (suspected) exposure to covid-19: Secondary | ICD-10-CM | POA: Insufficient documentation

## 2020-06-25 DIAGNOSIS — F17210 Nicotine dependence, cigarettes, uncomplicated: Secondary | ICD-10-CM | POA: Insufficient documentation

## 2020-06-25 DIAGNOSIS — F23 Brief psychotic disorder: Secondary | ICD-10-CM | POA: Diagnosis not present

## 2020-06-25 DIAGNOSIS — F39 Unspecified mood [affective] disorder: Secondary | ICD-10-CM

## 2020-06-25 DIAGNOSIS — Z5900 Homelessness unspecified: Secondary | ICD-10-CM

## 2020-06-25 MED ORDER — ACETAMINOPHEN 325 MG PO TABS
650.0000 mg | ORAL_TABLET | ORAL | Status: DC | PRN
  Administered 2020-06-25: 650 mg via ORAL
  Filled 2020-06-25: qty 2

## 2020-06-25 NOTE — ED Notes (Signed)
Patient had a loud outburst. I asked the patient why she was screaming, she stated she did not know.

## 2020-06-25 NOTE — ED Notes (Signed)
Patient is currently punching the bed and talking to herself.

## 2020-06-25 NOTE — ED Notes (Signed)
Pt seen and wand by security.  Pt has 2 bags of belongings in cabinet 13-18 side for hall D.

## 2020-06-25 NOTE — ED Notes (Signed)
Pt took Sani-Wipes and was wiping her genital area with them.

## 2020-06-25 NOTE — ED Provider Notes (Signed)
[facility]-EMERGENCY DEPT Provider Note   CSN: [id number] Arrival date & time: 06/25/20  1800     History CC:  " I don't know why I'm here."   Kiara [name] is a 20 y.o. female presenting emergency department with nonspecific psychiatric concern.  The patient was reportedly brought in by EMS after being called by bystanders being noted standing on the side of the road.  Per nursing triage note, the patient had reported to someone that she was suicidal.  However on my exam the patient denies that she is actively suicidal.  She is difficult time describing to me what she was doing earlier today, or why she is in the emergency room.  She was seen a few days ago for suicidal ideation (on 06/21/20) and underwent behavioral evaluation at that time.  She was felt to be reasonably stable for outpatient discharge and follow-up.  She does not recall following up with any specific counselor, cannot provide any further history for me.  She denies to me that she has an active plan to hurt herself or anyone else.  She denies any recent drug use.  HPI     No past medical history on file.  Patient Active Problem List   Diagnosis Date Noted   Brief psychotic disorder (HCC)    Unspecified mood (affective) disorder (HCC) 04/10/2020    No past surgical history on file.   OB History   No obstetric history on file.     No family history on file.  Social History   Tobacco Use   Smoking status: Every Day    Pack years: 0.00    Types: Cigarettes   Smokeless tobacco: Never  Vaping Use   Vaping Use: Some days  Substance Use Topics   Alcohol use: Yes   Drug use: Yes    Types: Marijuana    Home Medications Prior to Admission medications   Medication Sig Start Date End Date Taking? Authorizing Provider  hydrOXYzine (ATARAX/VISTARIL) 25 MG tablet Take 1 tablet (25 mg total) by mouth 3 (three) times daily as needed for anxiety. Patient not taking: Reported on 06/17/2020  04/10/20   Estella Husk, MD  OLANZapine zydis (ZYPREXA) 5 MG disintegrating tablet Take 1 tablet (5 mg total) by mouth 2 (two) times daily. Patient provided with medication samples, patient has been instructed to follow up with her outpatient provider for continued medication management. Patient not taking: Reported on 06/17/2020 04/16/20   Laveda Abbe, NP  traZODone (DESYREL) 50 MG tablet Take 1 tablet (50 mg total) by mouth at bedtime as needed for sleep. Patient not taking: Reported on 06/17/2020 04/10/20   Estella Husk, MD    Allergies    Patient has no known allergies.  Review of Systems   Review of Systems  Constitutional:  Negative for chills and fever.  HENT:  Negative for ear pain and sore throat.   Eyes:  Negative for pain and visual disturbance.  Respiratory:  Negative for cough and shortness of breath.   Cardiovascular:  Negative for chest pain and palpitations.  Gastrointestinal:  Negative for abdominal pain and vomiting.  Genitourinary:  Negative for dysuria and hematuria.  Musculoskeletal:  Negative for arthralgias and back pain.  Skin:  Negative for color change and rash.  Neurological:  Negative for seizures and syncope.  All other systems reviewed and are negative.  Physical Exam Updated Vital Signs BP 91/61 (BP Location: Left Arm)   Pulse 70   Temp  97.6 F (36.4 C) (Oral)   Resp 16   LMP 06/03/2020   SpO2 100%   Physical Exam Constitutional:      General: She is not in acute distress. HENT:     Head: Normocephalic and atraumatic.  Eyes:     Conjunctiva/sclera: Conjunctivae normal.     Pupils: Pupils are equal, round, and reactive to light.  Cardiovascular:     Rate and Rhythm: Normal rate and regular rhythm.  Pulmonary:     Effort: Pulmonary effort is normal. No respiratory distress.  Abdominal:     General: There is no distension.     Tenderness: There is no abdominal tenderness.  Skin:    General: Skin is warm and dry.   Neurological:     General: No focal deficit present.     Mental Status: She is alert and oriented to person, place, and time. Mental status is at baseline.    ED Results / Procedures / Treatments   Labs (all labs ordered are listed, but only abnormal results are displayed) Labs Reviewed  RESP PANEL BY RT-PCR (FLU A&B, COVID) [id number]    EKG None  Radiology No results found.  Procedures Procedures   Medications Ordered in ED Medications  acetaminophen (TYLENOL) tablet 650 mg (650 mg Oral Given 06/25/20 2303)    ED Course  I have reviewed the triage vital signs and the nursing notes.  Pertinent labs & imaging results that were available during my care of the patient were reviewed by me and considered in my medical decision making (see chart for details).  20 year old female here for psychiatric evaluation.  She has a flat affect on exam.  She is a difficult time providing history.  I suspect you may be some issues with schizoaffective disorder.  She is not actively suicidal or homicidal.  She is not under IVC.  Her vital signs are normal.  She shows no evidence of self-harm per her history or her exam.  I do think she is reasonably stable for safe transport and discharged to behavioral health for evaluation.  I have a lower suspicion otherwise for acute medical emergency.  *  Update - I spoke to [facility] providers who report that based on the patient's prior history of violent behavior, she would not be a candidate for transfer to their facility.  I explained that she is otherwise medically cleared at this time, and she will stay for a TTS evaluation.  She is here voluntarily, and not actively suicidal per my assessment, and therefore would be free to leave and follow up as an outpatient if she chooses to do so.     Final Clinical Impression(s) / ED Diagnoses Final diagnoses:  Psychiatric complaint    Rx / DC Orders ED Discharge Orders     None        Terald Sleeper, MD 06/26/20 1008

## 2020-06-25 NOTE — ED Notes (Signed)
Patient refused blood work at this time. Patient did not want to talk about her reason for visiting the ER.

## 2020-06-25 NOTE — ED Triage Notes (Signed)
Per EMS- Patient was standing on the side of the road with a group of people. Someone called 911. Patient reports that she is suicidal. Patient cooperative and calm.

## 2020-06-26 DIAGNOSIS — F23 Brief psychotic disorder: Secondary | ICD-10-CM | POA: Insufficient documentation

## 2020-06-26 DIAGNOSIS — Z5900 Homelessness unspecified: Secondary | ICD-10-CM

## 2020-06-26 LAB — RESP PANEL BY RT-PCR (FLU A&B, COVID) ARPGX2
Influenza A by PCR: NEGATIVE
Influenza B by PCR: NEGATIVE
SARS Coronavirus 2 by RT PCR: NEGATIVE

## 2020-06-26 MED ORDER — OLANZAPINE 5 MG PO TBDP
5.0000 mg | ORAL_TABLET | Freq: Every day | ORAL | Status: DC
  Administered 2020-06-26: 5 mg via ORAL
  Filled 2020-06-26: qty 1

## 2020-06-26 NOTE — Discharge Instructions (Signed)
It was our pleasure to provide your ER care today - we hope that you feel better.  Take your medication as prescribed.   Follow up with primary care doctor in the next 1-2 weeks.   For behavioral health symptoms and/or crisis, you may go directly to the Behavioral Health Urgent Care Center - they are open 24/7 and walk-ins are welcome.   Return to ER if worse, new symptoms, fevers, new or severe pain, trouble breathing, or other emergency concern.

## 2020-06-26 NOTE — ED Notes (Signed)
Patient is speaking to TTS

## 2020-06-26 NOTE — ED Notes (Signed)
Pt escorted out of ED by [employer]/security.

## 2020-06-26 NOTE — ED Provider Notes (Signed)
Patient has been psych cleared for d/c by [facility] team.  Pt w normal mood and affect. No thoughts of harm to self or others. No SI.   SW has met w pt and provided resources.   Pt currently appears stable for d/c.      Lajean Saver, MD 06/26/20 1520

## 2020-06-26 NOTE — Consult Note (Addendum)
[facility] Face-to-Face Psychiatry Consult   Reason for Consult: Psychiatric evaluation Referring Physician:  Dr. Estell Harpin, EDP  Patient Identification: Kiara [name] MRN:  [id number] Principal Diagnosis: <principal problem not specified> Diagnosis:  Active Problems:   Homelessness   Total Time spent with patient: 30 minutes  Subjective:   Kiara [name] is a 20 y.o. female patient admitted with presenting emergency department with nonspecific psychiatric concern.  The patient was reportedly brought in by EMS after being called by bystanders being noted standing on the side of the road.  Per nursing triage note, the patient had reported to someone that she was suicidal..  HPI:  Kiara [name], 20 y.o female seen by this provider face-to-face in the [facility] emergency department accompanied by no one.  Reports "I am homeless "the ambulance brought me here "I was not feeling like myself, someone else called them.  She is alert and oriented to person place situation.  Good eye contact throughout interview, stating quietly on chair in the emergency department, speech normal, volume normal does not appear to be responding to internal or external stimuli.  Endorses auditory hallucinations, "like a scary movie "reports them as having bad thoughts.  Denies visual hallucinations.  Reports that she has seen a ghost in the past.  Denies suicidal ideation, no intent no plan.  Denies homicidal ideation.  Denies to self injurious behaviors.  Reports that she smokes marijuana "a little and a lot "reports that she has that she will smoke it.  But not every day.  Endorses occasional alcohol use.  Last used 2 days ago.  Reports that she has trouble sleeping, cannot get to sleep.  Has a normal appetite, reports that she gets enough food.  She reports herself as homeless.  Does not have a psychiatric history, no psychiatrist/therapist.  Reports that her dad, Avianna Moynahan could be a possible support person for her she gave  me permission to phone Mr. Mancusi at [phone].  When asked about current medications, she said she supposed to take medications but does not take them and does not know the name of them.  She is able to contract for safety.   Past Psychiatric History: none per patient  Risk to Self:  no Risk to Others:  no Prior Inpatient Therapy:  no Prior Outpatient Therapy:  no  Past Medical History: No past medical history on file. No past surgical history on file. Family History: No family history on file. Family Psychiatric  History: unknown Social History:  Social History   Substance and Sexual Activity  Alcohol Use Yes     Social History   Substance and Sexual Activity  Drug Use Yes   Types: Marijuana    Social History   Socioeconomic History   Marital status: Single    Spouse name: Not on file   Number of children: Not on file   Years of education: Not on file   Highest education level: Not on file  Occupational History   Not on file  Tobacco Use   Smoking status: Every Day    Pack years: 0.00    Types: Cigarettes   Smokeless tobacco: Never  Vaping Use   Vaping Use: Some days  Substance and Sexual Activity   Alcohol use: Yes   Drug use: Yes    Types: Marijuana   Sexual activity: Never  Other Topics Concern   Not on file  Social History Narrative   Not on file   Social Determinants of Health   Financial  Resource Strain: Not on file  Food Insecurity: Not on file  Transportation Needs: Not on file  Physical Activity: Not on file  Stress: Not on file  Social Connections: Not on file   Additional Social History:    Allergies:  No Known Allergies  Labs:  Results for orders placed or performed during the hospital encounter of 06/25/20 (from the past 48 hour(s))  Resp Panel by RT-PCR (Flu A&B, Covid) Nasopharyngeal Swab     Status: None   Collection Time: 06/26/20  4:29 AM   Specimen: Nasopharyngeal Swab; Nasopharyngeal(NP) swabs in vial transport medium   Result Value Ref Range   SARS Coronavirus 2 by RT PCR NEGATIVE NEGATIVE    Comment: (NOTE) SARS-CoV-2 target nucleic acids are NOT DETECTED.  The SARS-CoV-2 RNA is generally detectable in upper respiratory specimens during the acute phase of infection. The lowest concentration of SARS-CoV-2 viral copies this assay can detect is 138 copies/mL. A negative result does not preclude SARS-Cov-2 infection and should not be used as the sole basis for treatment or other patient management decisions. A negative result may occur with  improper specimen collection/handling, submission of specimen other than nasopharyngeal swab, presence of viral mutation(s) within the areas targeted by this assay, and inadequate number of viral copies(<138 copies/mL). A negative result must be combined with clinical observations, patient history, and epidemiological information. The expected result is Negative.  Fact Sheet for Patients:  BloggerCourse.com  Fact Sheet for Healthcare Providers:  SeriousBroker.it  This test is no t yet approved or cleared by the [place] FDA and  has been authorized for detection and/or diagnosis of SARS-CoV-2 by FDA under an Emergency Use Authorization (EUA). This EUA will remain  in effect (meaning this test can be used) for the duration of the COVID-19 declaration under Section 564(b)(1) of the Act, 21 U.S.C.section 360bbb-3(b)(1), unless the authorization is terminated  or revoked sooner.       Influenza A by PCR NEGATIVE NEGATIVE   Influenza B by PCR NEGATIVE NEGATIVE    Comment: (NOTE) The Xpert Xpress SARS-CoV-2/FLU/RSV plus assay is intended as an aid in the diagnosis of influenza from Nasopharyngeal swab specimens and should not be used as a sole basis for treatment. Nasal washings and aspirates are unacceptable for Xpert Xpress SARS-CoV-2/FLU/RSV testing.  Fact Sheet for  Patients: BloggerCourse.com  Fact Sheet for Healthcare Providers: SeriousBroker.it  This test is not yet approved or cleared by the [place] FDA and has been authorized for detection and/or diagnosis of SARS-CoV-2 by FDA under an Emergency Use Authorization (EUA). This EUA will remain in effect (meaning this test can be used) for the duration of the COVID-19 declaration under Section 564(b)(1) of the Act, 21 U.S.C. section 360bbb-3(b)(1), unless the authorization is terminated or revoked.  Performed at [facility], [address], [place], [place] [address]     Current Facility-Administered Medications  Medication Dose Route Frequency Provider Last Rate Last Admin   acetaminophen (TYLENOL) tablet 650 mg  650 mg Oral Q4H PRN Terald Sleeper, MD   650 mg at 06/25/20 2303   Current Outpatient Medications  Medication Sig Dispense Refill   hydrOXYzine (ATARAX/VISTARIL) 25 MG tablet Take 1 tablet (25 mg total) by mouth 3 (three) times daily as needed for anxiety. (Patient not taking: Reported on 06/17/2020) 30 tablet 0   OLANZapine zydis (ZYPREXA) 5 MG disintegrating tablet Take 1 tablet (5 mg total) by mouth 2 (two) times daily. Patient provided with medication samples, patient has  been instructed to follow up with her outpatient provider for continued medication management. (Patient not taking: Reported on 06/17/2020) 14 tablet    traZODone (DESYREL) 50 MG tablet Take 1 tablet (50 mg total) by mouth at bedtime as needed for sleep. (Patient not taking: Reported on 06/17/2020)      Musculoskeletal: Strength & Muscle Tone: within normal limits Gait & Station: normal Patient leans: N/A            Psychiatric Specialty Exam:  Presentation  General Appearance: Appropriate for Environment  Eye Contact:Good  Speech:Clear and Coherent  Speech Volume:Normal  Handedness:Right   Mood and Affect   Mood:Euthymic  Affect:Congruent   Thought Process  Thought Processes:Coherent  Descriptions of Associations:Intact  Orientation:Full (Time, Place and Person)  Thought Content:Logical  History of Schizophrenia/Schizoaffective disorder:No  Duration of Psychotic Symptoms:Less than six months  Hallucinations:Hallucinations: Auditory Description of Auditory Hallucinations: like a scary movie Ideas of Reference:None  Suicidal Thoughts:Suicidal Thoughts: No Homicidal Thoughts:Homicidal Thoughts: No  Sensorium  Memory:Immediate Good; Recent Good; Recent Poor  Judgment:Fair  Insight:Fair   Executive Functions  Concentration:Good  Attention Span:Good  Recall:Good  Fund of Knowledge:Good  Language:Good   Psychomotor Activity  Psychomotor Activity: Psychomotor Activity: Normal  Assets  Assets:Communication Skills; Desire for Improvement; Physical Health   Sleep  Sleep: Sleep: Fair  Physical Exam: Physical Exam Pulmonary:     Effort: Pulmonary effort is normal.  Skin:    General: Skin is warm and dry.  Neurological:     Mental Status: She is alert and oriented to person, place, and time.  Psychiatric:        Attention and Perception: Attention normal.        Mood and Affect: Mood normal.        Speech: Speech normal.        Behavior: Behavior is cooperative.        Thought Content: Thought content does not include homicidal or suicidal ideation. Thought content does not include homicidal or suicidal plan.        Cognition and Memory: Cognition normal.   Review of Systems  Respiratory:  Negative for shortness of breath.   Cardiovascular:  Negative for chest pain.  Gastrointestinal:  Negative for abdominal pain, nausea and vomiting.  Neurological:  Negative for headaches.  Blood pressure 91/61, pulse 70, temperature 97.6 F (36.4 C), temperature source Oral, resp. rate 16, last menstrual period 06/03/2020, SpO2 100 %. There is no height or weight on  file to calculate BMI.  Treatment Plan Summary: Plan [facility] consult placed for housing resources. Contact father, Domenic Schwab for collateral.  Left message for Lekeisha Arenas at [phone] to discuss possible plan for discharge. Recommend restarting psychiatric medications: Zyprexa 5 mg PO BID.  Will start with once daily dose due to being off of medication.   Disposition: No evidence of imminent risk to self or others at present.   Patient does not meet criteria for psychiatric inpatient admission. Supportive therapy provided about ongoing stressors. Psychiatrically cleared. TOC consult placed for housing needs.   Novella Olive, NP 06/26/2020 1:46 PM

## 2020-06-26 NOTE — Progress Notes (Signed)
.  Transition of Care [facility]) - Emergency Department Mini Assessment   Patient Details  Name: Kiara [name] MRN: [id number] Date of Birth: [birth year]  [facility] CM/SW Contact:    Elliot Cousin, RN Phone Number: [phone] 06/26/2020, 1:58 PM   Clinical Narrative: TOC CM attempted to speak to pt at bedside to discuss homelessness. Explained homeless shelters in [place] and to take bus to [employer] or [employer] for placement.    ED Mini Assessment: What brought you to the Emergency Department? : SI  Barriers to Discharge: No Barriers Identified  Barrier interventions: provided pt with information on shelter, bus pass  Means of departure: Public Transportation  Interventions which prevented an admission or readmission: Transportation Screening, Homeless Screening    Patient Contact and Communications   Admission diagnosis:  SI Patient Active Problem List   Diagnosis Date Noted   Homelessness 06/26/2020   Brief psychotic disorder (HCC)    Unspecified mood (affective) disorder (HCC) 04/10/2020   PCP:  Pcp, No Pharmacy:   [facility] [phone] Ginette Otto,  - [address] [place] [address] Phone: [phone] Fax: [phone]

## 2020-06-26 NOTE — ED Notes (Signed)
Nurse Practitioner at the bedside for TTS assessment.

## 2020-06-26 NOTE — ED Notes (Addendum)
Pt moved to [facility] for APP assessment. APP assessment in progress.
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
## 2020-06-26 NOTE — ED Notes (Signed)
Provided with discharge papers, patient given a bus pass and list of local shelters. Given belongings back.

## 2020-06-26 NOTE — ED Notes (Signed)
Pt was psych/medically cleared, given bus pass, shelter resources, belongings, and is now refusing to leave. Security at bedside.

## 2020-06-26 NOTE — ED Notes (Signed)
Pt refused discharge vital signs

## 2020-06-26 NOTE — ED Notes (Signed)
Pt awake and follows commands however does not verbally answer questions and is uncooperative with temperature at this time.

## 2020-06-26 NOTE — ED Notes (Signed)
BREAKFAST TRAY GIVEN 

## 2020-06-26 NOTE — ED Notes (Signed)
Report received from nightshift RN Natosha. Per nightshift RN, pt declined all blood work but was agreeable to a Covid swab. Pt resting quietly with eyes closed at this time. Pt in direct line of sight from nurses station.

## 2020-06-26 NOTE — ED Notes (Signed)
Following APP assessment of pt, pt continues to hold in ED at this time. APP to contact pt's father, per pt permission.

## 2020-06-26 NOTE — ED Notes (Signed)
Lunch tray given. 

## 2020-06-28 ENCOUNTER — Encounter (HOSPITAL_COMMUNITY): Payer: Self-pay

## 2020-06-28 ENCOUNTER — Emergency Department (HOSPITAL_COMMUNITY)
Admission: EM | Admit: 2020-06-28 | Discharge: 2020-06-28 | Disposition: A | Discharge status: Home / Self Care | Payer: Medicaid Other | Attending: Emergency Medicine | Admitting: Emergency Medicine

## 2020-06-28 DIAGNOSIS — F1510 Other stimulant abuse, uncomplicated: Secondary | ICD-10-CM | POA: Insufficient documentation

## 2020-06-28 DIAGNOSIS — F10230 Alcohol dependence with withdrawal, uncomplicated: Secondary | ICD-10-CM | POA: Insufficient documentation

## 2020-06-28 DIAGNOSIS — F1910 Other psychoactive substance abuse, uncomplicated: Secondary | ICD-10-CM | POA: Insufficient documentation

## 2020-06-28 DIAGNOSIS — Z5321 Procedure and treatment not carried out due to patient leaving prior to being seen by health care provider: Secondary | ICD-10-CM | POA: Diagnosis not present

## 2020-06-28 DIAGNOSIS — Y900 Blood alcohol level of less than 20 mg/100 ml: Secondary | ICD-10-CM | POA: Diagnosis not present

## 2020-06-28 LAB — CBC WITH DIFFERENTIAL/PLATELET
Abs Immature Granulocytes: 0.03 10*3/uL (ref 0.00–0.07)
Basophils Absolute: 0 10*3/uL (ref 0.0–0.1)
Basophils Relative: 0 %
Eosinophils Absolute: 0.1 10*3/uL (ref 0.0–0.5)
Eosinophils Relative: 1 %
HCT: 42.5 % (ref 36.0–46.0)
Hemoglobin: 13.7 g/dL (ref 12.0–15.0)
Immature Granulocytes: 0 %
Lymphocytes Relative: 28 %
Lymphs Abs: 2.2 10*3/uL (ref 0.7–4.0)
MCH: 30.6 pg (ref 26.0–34.0)
MCHC: 32.2 g/dL (ref 30.0–36.0)
MCV: 94.9 fL (ref 80.0–100.0)
Monocytes Absolute: 0.9 10*3/uL (ref 0.1–1.0)
Monocytes Relative: 11 %
Neutro Abs: 4.7 10*3/uL (ref 1.7–7.7)
Neutrophils Relative %: 60 %
Platelets: 272 10*3/uL (ref 150–400)
RBC: 4.48 MIL/uL (ref 3.87–5.11)
RDW: 13.2 % (ref 11.5–15.5)
WBC: 8 10*3/uL (ref 4.0–10.5)
nRBC: 0 % (ref 0.0–0.2)

## 2020-06-28 LAB — COMPREHENSIVE METABOLIC PANEL
ALT: 20 U/L (ref 0–44)
AST: 23 U/L (ref 15–41)
Albumin: 4.3 g/dL (ref 3.5–5.0)
Alkaline Phosphatase: 83 U/L (ref 38–126)
Anion gap: 9 (ref 5–15)
BUN: 8 mg/dL (ref 6–20)
CO2: 27 mmol/L (ref 22–32)
Calcium: 9.4 mg/dL (ref 8.9–10.3)
Chloride: 103 mmol/L (ref 98–111)
Creatinine, Ser: 0.67 mg/dL (ref 0.44–1.00)
GFR, Estimated: >60 mL/min (ref >60)
Glucose, Bld: 80 mg/dL (ref 70–99)
Potassium: 3.9 mmol/L (ref 3.5–5.1)
Sodium: 139 mmol/L (ref 135–145)
Total Bilirubin: 0.5 mg/dL (ref 0.3–1.2)
Total Protein: 7.5 g/dL (ref 6.5–8.1)

## 2020-06-28 LAB — RAPID URINE DRUG SCREEN, HOSP PERFORMED
Amphetamines: NOT DETECTED
Barbiturates: NOT DETECTED
Benzodiazepines: NOT DETECTED
Cocaine: NOT DETECTED
Opiates: NOT DETECTED
Tetrahydrocannabinol: POSITIVE — AB

## 2020-06-28 LAB — ACETAMINOPHEN LEVEL: Acetaminophen (Tylenol), Serum: <10 ug/mL — ABNORMAL LOW (ref 10–30)

## 2020-06-28 LAB — I-STAT BETA HCG BLOOD, ED (MC, WL, AP ONLY): I-stat hCG, quantitative: <5 m[IU]/mL (ref <5)

## 2020-06-28 LAB — ETHANOL: Alcohol, Ethyl (B): <10 mg/dL (ref <10)

## 2020-06-28 LAB — SALICYLATE LEVEL: Salicylate Lvl: <7 mg/dL — ABNORMAL LOW (ref 7.0–30.0)

## 2020-06-28 NOTE — ED Provider Notes (Signed)
Emergency Medicine Provider Triage Evaluation Note  Kiara [name] , a 20 y.o. female  was evaluated in triage.  Pt checked in for psych concerns.  Initially just staring at the wall during triage and not speaking.  After much coaxing from RN, patient reports addiction issues and is "not really" suicidal.  Frequent visits for psychiatric complaints, most recent assessment 06/25/20.  Review of Systems  Positive: Substance issues Negative: Chest pain, abdominal pain  Physical Exam  BP 113/77 (BP Location: Right Arm)   Pulse 72   Temp 98.2 F (36.8 C) (Oral)   Resp 17   LMP 06/03/2020   SpO2 100%   Gen:   Awake, no distress, odd affect, staring at wall Resp:  Normal effort  MSK:   Moves extremities without difficulty, in wet purple hospital scrubs from prior visit Other:  Blunts removed from patient's pants pockets  Medical Decision Making  Medically screening exam initiated at 5:51 AM.  Appropriate orders placed.  Allahna Husband was informed that the remainder of the evaluation will be completed by another provider, this initial triage assessment does not replace that evaluation, and the importance of remaining in the ED until their evaluation is complete.    Garlon Hatchet, PA-C 06/28/20 0602    Glynn Octave, MD 06/28/20 [phone]

## 2020-06-28 NOTE — BHH Counselor (Signed)
TTS attempted to see patient was informed patient walked out/left (AMA) the ED.  TTS consult will be receded.

## 2020-06-28 NOTE — ED Triage Notes (Signed)
Pt is not fourth coming with information states that she wants help with drugs, admits to ETOH and mariajuana use today, had drugs on her, taken and disposed of. Pt does not answer SI/HI/AVH questions

## 2020-06-28 NOTE — ED Notes (Signed)
Patient called for treatment room x1, no answer. 
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
## 2020-06-28 NOTE — ED Notes (Addendum)
Pt not in room.  I did not meet pt., EDT states pt has left, charge RN notified

## 2020-10-01 ENCOUNTER — Ambulatory Visit (HOSPITAL_COMMUNITY)
Admission: RE | Admit: 2020-10-01 | Discharge: 2020-10-01 | Disposition: A | Discharge status: Home / Self Care | Payer: Medicaid Other | Attending: Registered Nurse | Admitting: Registered Nurse

## 2020-10-01 ENCOUNTER — Other Ambulatory Visit: Payer: Self-pay

## 2020-10-01 ENCOUNTER — Encounter (HOSPITAL_COMMUNITY): Payer: Self-pay | Admitting: Registered Nurse

## 2020-10-01 DIAGNOSIS — F23 Brief psychotic disorder: Secondary | ICD-10-CM | POA: Insufficient documentation

## 2020-10-01 NOTE — Discharge Summary (Signed)
Dawayne Patricia to be D/C'd Home per NP order. Discussed with the patient and all questions fully answered. An After Visit Summary was printed and given to the patient. Patient escorted via WC, and D/C home via private auto.  Dickie La  10/01/2020 1:26 PM
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
## 2020-10-04 ENCOUNTER — Other Ambulatory Visit: Payer: Self-pay

## 2020-10-04 ENCOUNTER — Ambulatory Visit (HOSPITAL_COMMUNITY)
Admission: EM | Admit: 2020-10-04 | Discharge: 2020-10-04 | Disposition: A | Discharge status: Home / Self Care | Payer: Medicaid Other | Attending: Behavioral Health | Admitting: Behavioral Health

## 2020-10-04 DIAGNOSIS — F23 Brief psychotic disorder: Secondary | ICD-10-CM | POA: Insufficient documentation

## 2020-10-04 MED ORDER — OLANZAPINE 5 MG PO TBDP: 5.0000 mg | ORAL_TABLET | Freq: Two times a day (BID) | ORAL | 0 refills | Status: DC

## 2020-10-04 NOTE — BH Assessment (Signed)
Per dad pt reports that pt has not ate in past two days, vomiting "white stuff" talking to self, hallucinating, urinating on self, not bathing and not communicating with him. Pt not responding to TTS.

## 2020-10-04 NOTE — Discharge Instructions (Addendum)

## 2020-10-04 NOTE — Progress Notes (Addendum)
John and her DDS guardian received the AVS, questions were answered and escorted to the lobby.

## 2020-10-04 NOTE — ED Provider Notes (Signed)
Behavioral Health Urgent Care Medical Screening Exam  Patient Name: Kiara [name] MRN: [id number] Date of Evaluation: 10/04/20 Diagnosis:  Final diagnoses:  Brief psychotic disorder (HCC)   History of Present illness: Kiara [name] is a 20 y.o. female patient who presents to the [facility] voluntarily as a walk-in accompanied by her father Mr. Roemmich with a chief complaint of the patient urinating on herself, vomiting [place] foam after she eats, talking to herself and not responding.   Patient seen and evaluated face-to-face by this provider and chart reviewed. Case discussed with Dr. Lucianne Muss. On evaluation, patient is alert. She is observed to be facing the door with poor eye contact. When asked what brought you in to be evaluated, she does not respond. When asked if she is willing to complete the assessment, she does not respond. She then closes her eyes and holds her head down. She refuses to participate in the assessment AEB not answering questions asked by this provider and the TTS counselor. There is no evidence that the patient is suicidal, homicidal or psychotic.    Mr. Cudmore was informed that the patient does not meet criteria for inpatient treatment at this time due to no evidence of suicidal ideations, homicidal ideations or psychosis. He states that she needed to be back on her medications. Per chart review, patient was previously prescribed Zyprexa zydis 5 mg po BID for mood dx. Patient restarted on Zyprexa 5 mg po BID. A prescriptions for Zyprexa 5 mg po BID x 30 tablets (15 days) sent electronically to the pharmacy on file. Mr. Pine was encouraged to have the patient follow up at the [facility] outpatient for medication management. Mr. Fredrik Cove was advise to IVC the patient if she displays signs of self harm behaviors, harm to others, or hallucinations.  Psychiatric Specialty Exam  Presentation  General Appearance:Appropriate for Environment  Eye  Contact:None  Speech:Other (comment) (Pt refused to speak/participate)  Speech Volume:Other (comment)  Handedness:Right   Mood and Affect  Mood:Euphoric  Affect:Congruent   Thought Process  Thought Processes:-- (Pt refused to speak/participate)  Descriptions of Associations:-- (Unable to assess patient would not participate in asessment)  Orientation:-- (Pt refused to speak/participate)  Thought Content:Other (comment) (Pt refused to speak/participate)  Diagnosis of Schizophrenia or Schizoaffective disorder in past: No  Duration of Psychotic Symptoms: Less than six months  Hallucinations:Other (comment) (Pt refused to speak/participate) like a scary movie  Ideas of Reference:Other (comment) (Pt refused to speak/participate)  Suicidal Thoughts:-- (Pt refused to speak/participate)  Homicidal Thoughts:-- (Pt refused to speak/participate)   Sensorium  Memory:Other (comment) (Pt refused to speak/participate)  Judgment:-- (Unable to assess patient would not participate in asessment)  Insight:Other (comment) (Pt refused to speak/participate)   Investment banker, operational (comment) (Pt refused to speak/participate)  Attention Span:Other (comment) (Pt refused to speak/participate)  Recall:Other (comment) (Pt refused to speak/participate)  Fund of Knowledge:Other (comment) (Pt refused to speak/participate)  Language:Other (comment) (Pt refused to speak/participate)   Psychomotor Activity  Psychomotor Activity:Normal   Assets  Assets:Social Support; Housing (per father)   Sleep  Sleep:-- (Pt refused to speak/participate)   Physical Exam: Physical Exam Constitutional:      Appearance: Normal appearance.  Neurological:     Mental Status: She is alert.   Review of Systems  Unable to perform ROS: Psychiatric disorder (Pt refused to speak/participate)  There were no vitals taken for this visit. There is no height or weight on file to calculate  BMI. Patient refused vital signs.  Musculoskeletal: Strength & Muscle Tone: within normal limits Gait & Station: normal Patient leans: N/A   [facility] MSE Discharge Disposition for Follow up and Recommendations: Based on my evaluation the patient does not appear to have an emergency medical condition and can be discharged with resources and follow up care in outpatient services for Medication Management, Substance Abuse Intensive Outpatient Program, Individual Therapy, and Group Therapy   Follow-up Information     Go to  [facility].   Specialty: Urgent Care Why: Open access walk-in hours Monday - Thursday 8 am to 11 am for medication management and therapy or call to schedule an appointment. Contact information: [phone] [place] [address] [phone]                 Layla Barter, NP 10/04/2020, 4:41 PM

## 2020-10-07 ENCOUNTER — Other Ambulatory Visit: Payer: Self-pay

## 2020-10-07 ENCOUNTER — Encounter (HOSPITAL_COMMUNITY): Payer: Self-pay

## 2020-10-07 ENCOUNTER — Emergency Department (HOSPITAL_COMMUNITY)
Admission: EM | Admit: 2020-10-07 | Discharge: 2020-10-10 | Disposition: A | Discharge status: Home / Self Care | Payer: Medicaid Other | Attending: Emergency Medicine | Admitting: Emergency Medicine

## 2020-10-07 DIAGNOSIS — Z20822 Contact with and (suspected) exposure to covid-19: Secondary | ICD-10-CM | POA: Insufficient documentation

## 2020-10-07 DIAGNOSIS — F17210 Nicotine dependence, cigarettes, uncomplicated: Secondary | ICD-10-CM | POA: Insufficient documentation

## 2020-10-07 DIAGNOSIS — R1110 Vomiting, unspecified: Secondary | ICD-10-CM | POA: Diagnosis not present

## 2020-10-07 DIAGNOSIS — R4182 Altered mental status, unspecified: Secondary | ICD-10-CM | POA: Diagnosis present

## 2020-10-07 DIAGNOSIS — F05 Delirium due to known physiological condition: Secondary | ICD-10-CM | POA: Insufficient documentation

## 2020-10-07 DIAGNOSIS — F29 Unspecified psychosis not due to a substance or known physiological condition: Secondary | ICD-10-CM | POA: Diagnosis not present

## 2020-10-07 DIAGNOSIS — F23 Brief psychotic disorder: Secondary | ICD-10-CM

## 2020-10-07 LAB — CBC WITH DIFFERENTIAL/PLATELET
Abs Immature Granulocytes: 0.05 10*3/uL (ref 0.00–0.07)
Basophils Absolute: 0 10*3/uL (ref 0.0–0.1)
Basophils Relative: 0 %
Eosinophils Absolute: 0.1 10*3/uL (ref 0.0–0.5)
Eosinophils Relative: 2 %
HCT: 38.3 % (ref 36.0–46.0)
Hemoglobin: 12.7 g/dL (ref 12.0–15.0)
Immature Granulocytes: 1 %
Lymphocytes Relative: 27 %
Lymphs Abs: 1.9 10*3/uL (ref 0.7–4.0)
MCH: 31.1 pg (ref 26.0–34.0)
MCHC: 33.2 g/dL (ref 30.0–36.0)
MCV: 93.6 fL (ref 80.0–100.0)
Monocytes Absolute: 0.9 10*3/uL (ref 0.1–1.0)
Monocytes Relative: 12 %
Neutro Abs: 4 10*3/uL (ref 1.7–7.7)
Neutrophils Relative %: 58 %
Platelets: 237 10*3/uL (ref 150–400)
RBC: 4.09 MIL/uL (ref 3.87–5.11)
RDW: 13.7 % (ref 11.5–15.5)
WBC: 7 10*3/uL (ref 4.0–10.5)
nRBC: 0 % (ref 0.0–0.2)

## 2020-10-07 LAB — COMPREHENSIVE METABOLIC PANEL
ALT: 20 U/L (ref 0–44)
AST: 21 U/L (ref 15–41)
Albumin: 4 g/dL (ref 3.5–5.0)
Alkaline Phosphatase: 53 U/L (ref 38–126)
Anion gap: 9 (ref 5–15)
BUN: 10 mg/dL (ref 6–20)
CO2: 25 mmol/L (ref 22–32)
Calcium: 8.9 mg/dL (ref 8.9–10.3)
Chloride: 104 mmol/L (ref 98–111)
Creatinine, Ser: 0.55 mg/dL (ref 0.44–1.00)
GFR, Estimated: >60 mL/min (ref >60)
Glucose, Bld: 96 mg/dL (ref 70–99)
Potassium: 3.6 mmol/L (ref 3.5–5.1)
Sodium: 138 mmol/L (ref 135–145)
Total Bilirubin: 0.6 mg/dL (ref 0.3–1.2)
Total Protein: 7.1 g/dL (ref 6.5–8.1)

## 2020-10-07 LAB — RAPID URINE DRUG SCREEN, HOSP PERFORMED
Amphetamines: NOT DETECTED
Barbiturates: NOT DETECTED
Benzodiazepines: NOT DETECTED
Cocaine: NOT DETECTED
Opiates: NOT DETECTED
Tetrahydrocannabinol: NOT DETECTED

## 2020-10-07 LAB — ACETAMINOPHEN LEVEL: Acetaminophen (Tylenol), Serum: <10 ug/mL — ABNORMAL LOW (ref 10–30)

## 2020-10-07 LAB — SALICYLATE LEVEL: Salicylate Lvl: <7 mg/dL — ABNORMAL LOW (ref 7.0–30.0)

## 2020-10-07 LAB — HCG, SERUM, QUALITATIVE: Preg, Serum: NEGATIVE

## 2020-10-07 LAB — ETHANOL: Alcohol, Ethyl (B): <10 mg/dL (ref <10)

## 2020-10-07 MED ORDER — LORAZEPAM 2 MG/ML IJ SOLN: 2.0000 mg | Freq: Once | INTRAMUSCULAR | Status: AC

## 2020-10-07 MED ORDER — LORAZEPAM 1 MG PO TABS
2.0000 mg | ORAL_TABLET | Freq: Once | ORAL | Status: AC
  Administered 2020-10-08: 2 mg via ORAL
  Filled 2020-10-07: qty 2

## 2020-10-07 NOTE — ED Triage Notes (Signed)
Patient's father reports that the patient  "would act like she was falling asleep and have slobber running out of her mouth." Patient's father reports "that her mind is blank."  Patient was drinking a soft drink when name called to come to triage and walked past writer without incident.  Patient's father states, "I think she smokes weed."

## 2020-10-07 NOTE — ED Provider Notes (Signed)
[facility] DEPT Provider Note   CSN: [id number] Arrival date & time: 10/07/20  1303     History Chief Complaint  Patient presents with   Emesis   Altered Mental Status    Kiara [name] is a 20 y.o. female.  Level 5 caveat, psychosis  20 year old female with history of unspecified mood disorder, psychiatric disorder presents to the ER accompanied by her father with a myriad of complaints.  Father reports over the past month a month and a half she has been not speaking when she is spoken to. Not taking her medications. She will chew them up and spit the medications out. Eating intermittently but will spit her food out on the floor. Has episodes where she "just stares off" and does not speak to him. Sometimes will urinate on herself. No seizure history. She will look at the father and smile but not speak when the father asks her questions. No fevers or chills were reported, no change to bathroom habits. She did not report hallucinations, increased alcohol use, he is concerned that she is using marijuana intermittently.   The history is provided by the patient and a relative. The history is limited by the condition of the patient. No language interpreter was used.  Emesis Altered Mental Status Associated symptoms: vomiting       History reviewed. No pertinent past medical history.  Patient Active Problem List   Diagnosis Date Noted   Homelessness 06/26/2020   Brief psychotic disorder ([place])    Unspecified mood (affective) disorder ([place]) 04/10/2020    History reviewed. No pertinent surgical history.   OB History   No obstetric history on file.     History reviewed. No pertinent family history.  Social History   Tobacco Use   Smoking status: Every Day    Types: Cigarettes   Smokeless tobacco: Never  Vaping Use   Vaping Use: Some days  Substance Use Topics   Alcohol use: Yes   Drug use: Yes    Types: Marijuana    Home  Medications Prior to Admission medications   Medication Sig Start Date End Date Taking? Authorizing Provider  OLANZapine zydis (ZYPREXA) 5 MG disintegrating tablet Take 1 tablet (5 mg total) by mouth 2 (two) times daily. 10/04/20   White, Mellody Life, NP    Allergies    Patient has no known allergies.  Review of Systems   Review of Systems  Unable to perform ROS: Psychiatric disorder  Gastrointestinal:  Positive for vomiting.   Physical Exam Updated Vital Signs BP 103/76   Pulse 80   Temp 98.8 F (37.1 C) (Oral)   Resp 20   Wt 62.4 kg   LMP  (LMP Unknown)   SpO2 100%   BMI 24.36 kg/m   Physical Exam Vitals and nursing note reviewed.  Constitutional:      General: She is not in acute distress.    Appearance: Normal appearance.  HENT:     Head: Normocephalic and atraumatic.     Right Ear: External ear normal.     Left Ear: External ear normal.     Nose: Nose normal.     Mouth/Throat:     Mouth: Mucous membranes are moist.  Eyes:     General: No scleral icterus.       Right eye: No discharge.        Left eye: No discharge.     Comments: Patient will resist manual eye opening, looks away from light when I  attempt to assess her pupils.   Cardiovascular:     Rate and Rhythm: Normal rate and regular rhythm.     Pulses: Normal pulses.     Heart sounds: Normal heart sounds.  Pulmonary:     Effort: Pulmonary effort is normal. No respiratory distress.     Breath sounds: Normal breath sounds.  Abdominal:     General: Abdomen is flat.     Tenderness: There is no abdominal tenderness.  Musculoskeletal:        General: Normal range of motion.     Cervical back: Normal range of motion.     Right lower leg: No edema.     Left lower leg: No edema.  Skin:    General: Skin is warm and dry.     Capillary Refill: Capillary refill takes less than 2 seconds.  Neurological:     Mental Status: She is alert.     GCS: GCS eye subscore is 4. GCS verbal subscore is 1. GCS motor  subscore is 6.     Cranial Nerves: Cranial nerves are intact. No facial asymmetry.     Motor: No tremor.     Comments: No clonus, variable compliance with neurological exam.   Psychiatric:        Attention and Perception: She is inattentive.        Mood and Affect: Affect is inappropriate.        Speech: She is noncommunicative.        Behavior: Behavior is uncooperative and withdrawn.     Comments: Appears to be responding to internal stimuli. Intermittently laughs. Inattentive.     ED Results / Procedures / Treatments   Labs (all labs ordered are listed, but only abnormal results are displayed) Labs Reviewed  SALICYLATE LEVEL - Abnormal; Notable for the following components:      Result Value   Salicylate Lvl <6.2 (*)    All other components within normal limits  ACETAMINOPHEN LEVEL - Abnormal; Notable for the following components:   Acetaminophen (Tylenol), Serum <10 (*)    All other components within normal limits  RESP PANEL BY RT-PCR (FLU A&B, COVID) ARPGX2  CBC WITH DIFFERENTIAL/PLATELET  COMPREHENSIVE METABOLIC PANEL  HCG, SERUM, QUALITATIVE  RAPID URINE DRUG SCREEN, HOSP PERFORMED  ETHANOL  CK    EKG EKG Interpretation  Date/Time:  Tuesday October 08 2020 00:10:06 EDT Ventricular Rate:  77 PR Interval:  161 QRS Duration: 89 QT Interval:  377 QTC Calculation: 427 R Axis:   -1 Text Interpretation: Sinus rhythm Low voltage, precordial leads Confirmed by Wynona Dove ([id number]) on 10/08/2020 2:02:50 AM  Radiology No results found.  Procedures Procedures   Medications Ordered in ED Medications  LORazepam (ATIVAN) injection 2 mg ( Intramuscular See Alternative 10/08/20 0007)    Or  LORazepam (ATIVAN) tablet 2 mg (2 mg Oral Given 10/08/20 0007)    ED Course  I have reviewed the triage vital signs and the nursing notes.  Pertinent labs & imaging results that were available during my care of the patient were reviewed by me and considered in my medical decision  making (see chart for details).    MDM Rules/Calculators/A&P                         This patient complains of delirium; this involves an extensive number of treatment options and is a complaint that carries with it a high risk of complications and Morbidity. Vital signs reviewed and  are stable. Serious etiologies considered.   I ordered, reviewed and interpreted labs which were stable  Additional history obtained from father Previous records obtained and reviewed   I consulted TTS    Patient medically cleared. I am concern for acute psychosis. She has been evaluated by psychiatry who recommends inpatient. Patient does not want to stay in ED for evaluation, remains acutely psychotic. Concern she is a danger to herself. Will place under IVC.    Final Clinical Impression(s) / ED Diagnoses Final diagnoses:  Acute psychosis [facility])    Rx / DC Orders ED Discharge Orders     None        Jeanell Sparrow, DO 10/08/20 0203

## 2020-10-07 NOTE — BH Assessment (Signed)
Comprehensive Clinical Assessment (CCA) Note  10/07/2020 Kiara [name] [id number]  DISPOSITION: Gave clinical report to Kiara Conn, FNP who determined Pt meets criteria for inpatient psychiatric treatment. [name], [facility], confirmed adult unit is at capacity. Notified [name] and [name], RN of recommendation via secure message.  The patient demonstrates the following risk factors for suicide: Chronic risk factors for suicide include: psychiatric disorder of mood disorder and substance use disorder. Acute risk factors for suicide include: N/A. Protective factors for this patient include: positive social support. Considering these factors, the overall suicide risk at this point appears to be low. Patient is not appropriate for outpatient follow up.  Flowsheet Row ED from 06/28/2020 in [facility] EMERGENCY DEPARTMENT ED from 06/21/2020 in [facility] ED from 06/17/2020 in [facility]  C-SSRS RISK CATEGORY Error: Question 6 not populated Error: Q3, 4, or 5 should not be populated when Q2 is No High Risk      Pt is a 20 year old single female who presents to [name] ED accompanied by her father Kiara [name], who participated in assessment. Pt's medical record indicates Pt has a history of mood disorder, psychotic symptoms, and substance use. During assessment, Pt would not respond verbally to anyone. She kept her eyes closed and made no acknowledgement of anyone in the room. Pt's father says Pt has been acting odd for several days and will not respond. He says several times of the past couple days she is foaming at the mouth and appears to be vomiting what looks like chalk or toothpaste. He says Pt has been drinking fluids and eating small amounts. He reports she has been going to the bathroom but recently had a couple of incidents of urinating on herself. Pt's father suspects there is something  neurologically wrong or that the Pt has used drugs. Pt's urine drug screen is negative.  Medical record indicates Pt has been evaluated at [facility] twice in the past week for similar symptoms. In both evaluations, Pt would not respond to any questions. In evaluation on 10/01/2020, pulled her hoodie over her head and was laughing but did not respond to questions. Currently patient is living with her sister. She was last psychiatrically hospitalized on March 2022 at [facility].  Chief Complaint:  Chief Complaint  Patient presents with   Emesis   Altered Mental Status   Visit Diagnosis:  F29 Unspecified psychotic disorder   CCA Screening, Triage and Referral (STR)  Patient Reported Information How did you hear about [place]? Family/Friend  What Is the Reason for Your Visit/Call Today? Pt has has a history of psychotic symptoms and substance use. Per Pt's father report, Pt has been behaving oddly and not talking. He says when sleeping she appears to be drooling and foaming at the mouth. Pt will not speak or acknowledge anyone in the room.  How Long Has This Been Causing You Problems? 1 wk - 1 month  What Do You Feel Would Help You the Most Today? -- (Unable to assess due to Pt not communicating.)   Have You Recently Had Any Thoughts About Hurting Yourself? -- (Unable to assess due to Pt not communicating.)  Are You Planning to Commit Suicide/Harm Yourself At This time? -- (Unable to assess due to Pt not communicating.)   Have you Recently Had Thoughts About Hurting Someone Else? -- (Unable to assess due to Pt not communicating.)  Are You Planning to Harm  Someone at This Time? -- (Unable to assess due to Pt not communicating.)  Explanation: No data recorded  Have You Used Any Alcohol or Drugs in the Past 24 Hours? -- (Unable to assess due to Pt not communicating.)  How Long Ago Did You Use Drugs or Alcohol? No data recorded What Did You Use and How Much? weed   Do You Currently Have a  Therapist/Psychiatrist? No  Name of Therapist/Psychiatrist: No data recorded  Have You Been Recently Discharged From Any Office Practice or Programs? -- (Unable to assess due to Pt not communicating.)  Explanation of Discharge From Practice/Program: No data recorded    CCA Screening Triage Referral Assessment Type of Contact: Tele-Assessment  Telemedicine Service Delivery: Telemedicine service delivery: This service was provided via telemedicine using a 2-way, interactive audio and video technology  Is this Initial or Reassessment? Initial Assessment  Date Telepsych consult ordered in [facility]:  10/07/20  Time Telepsych consult ordered in [facility]:  2134  Location of Assessment: [facility] ED  Provider Location: [facility] Assessment Services   Collateral Involvement: Pt's father: Briceyda Abdullah   Does Patient Have a Automotive engineer Guardian? No data recorded Name and Contact of Legal Guardian: No data recorded If Minor and Not Living with Parent(s), Who has Custody? NA  Is CPS involved or ever been involved? Never  Is APS involved or ever been involved? Never   Patient Determined To Be At Risk for Harm To Self or Others Based on Review of Patient Reported Information or Presenting Complaint? No  Method: No data recorded Availability of Means: No data recorded Intent: No data recorded Notification Required: No data recorded Additional Information for Danger to Others Potential: No data recorded Additional Comments for Danger to Others Potential: No data recorded Are There Guns or Other Weapons in Your Home? No data recorded Types of Guns/Weapons: No data recorded Are These Weapons Safely Secured?                            No data recorded Who Could Verify You Are Able To Have These Secured: No data recorded Do You Have any Outstanding Charges, Pending Court Dates, Parole/Probation? No data recorded Contacted To Inform of Risk of Harm To Self or Others: No data recorded   Does  Patient Present under Involuntary Commitment? No  IVC Papers Initial File Date: No data recorded  [place] of Residence: [place]   Patient Currently Receiving the Following Services: Not Receiving Services   Determination of Need: Emergent (2 hours)   Options For Referral: Inpatient Hospitalization     CCA Biopsychosocial Patient Reported Schizophrenia/Schizoaffective Diagnosis in Past: No   Strengths: Unable to assess due to Pt not communicating.   Mental Health Symptoms Depression:   Change in energy/activity; Difficulty Concentrating; Sleep (too much or little); Increase/decrease in appetite   Duration of Depressive symptoms:  Duration of Depressive Symptoms: Greater than two weeks   Mania:   None   Anxiety:    -- (Unable to assess due to Pt not communicating.)   Psychosis:   Affective flattening/alogia/avolition   Duration of Psychotic symptoms:  Duration of Psychotic Symptoms: Less than six months   Trauma:   -- (Unable to assess due to Pt not communicating.)   Obsessions:   -- (Unable to assess due to Pt not communicating.)   Compulsions:   -- (Unable to assess due to Pt not communicating.)   Inattention:   -- (Unable to  assess due to Pt not communicating.)   Hyperactivity/Impulsivity:   -- (Unable to assess due to Pt not communicating.)   Oppositional/Defiant Behaviors:   -- (Unable to assess due to Pt not communicating.)   Emotional Irregularity:   -- (Unable to assess due to Pt not communicating.)   Other Mood/Personality Symptoms:   NA    Mental Status Exam Appearance and self-care  Stature:   Small   Weight:   Average weight   Clothing:   -- (Covered by blanket)   Grooming:   Neglected   Cosmetic use:   None   Posture/gait:   Normal   Motor activity:   Not Remarkable   Sensorium  Attention:   Inattentive   Concentration:   -- (Unable to assess due to Pt not communicating.)   Orientation:   -- (Unable to  assess due to Pt not communicating.)   Recall/memory:   -- (Unable to assess due to Pt not communicating.)   Affect and Mood  Affect:   Full Range   Mood:   -- (Unable to assess due to Pt not communicating.)   Relating  Eye contact:   None   Facial expression:   Constricted   Attitude toward examiner:   Uninterested   Thought and Language  Speech flow:  Mute   Thought content:   -- (Unable to assess due to Pt not communicating.)   Preoccupation:   -- (Unable to assess due to Pt not communicating.)   Hallucinations:   -- (Unable to assess due to Pt not communicating.)   Organization:  No data recorded  [employer] of Knowledge:   Fair   Intelligence:   Above Average   Abstraction:   -- (Unable to assess due to Pt not communicating.)   Judgement:   Poor   Reality Testing:   -- (Unable to assess due to Pt not communicating.)   Insight:   -- (Unable to assess due to Pt not communicating.)   Decision Making:   -- (Unable to assess due to Pt not communicating.)   Social Functioning  Social Maturity:   Isolates   Social Judgement:   "Street Smart"   Stress  Stressors:   -- (Unable to assess due to Pt not communicating.)   Coping Ability:   -- (Unable to assess due to Pt not communicating.)   Skill Deficits:   -- (Unable to assess due to Pt not communicating.)   Supports:   Family     Religion: Religion/Spirituality Are You A Religious Person?: No How Might This Affect Treatment?: UTA  Leisure/Recreation: Leisure / Recreation Do You Have Hobbies?:  (Unable to assess due to Pt not communicating.)  Exercise/Diet: Exercise/Diet Do You Exercise?:  (Unable to assess due to Pt not communicating.) Have You Gained or Lost A Significant Amount of Weight in the Past Six Months?:  (Unable to assess due to Pt not communicating.) Do You Follow a Special Diet?:  (Unable to assess due to Pt not communicating.) Do You Have Any  Trouble Sleeping?:  (Unable to assess due to Pt not communicating.)   CCA Employment/Education Employment/Work Situation: Employment / Work Situation Employment Situation: Unemployed Patient's Job has Been Impacted by Current Illness: Yes Describe how Patient's Job has Been Impacted: unable to maintain employment Has Patient ever Been in the [employer]?: No  Education: Education Is Patient Currently Attending School?: No Last Grade Completed: 12 Did You Product manager?: No Did You Have An Individualized Education Program (IIEP): No  Did You Have Any Difficulty At School?: No Patient's Education Has Been Impacted by Current Illness: No   CCA Family/Childhood History Family and Relationship History: Family history Marital status: Single Does patient have children?: No  Childhood History:  Childhood History By whom was/is the patient raised?: Mother Did patient suffer any verbal/emotional/physical/sexual abuse as a child?:  (Unable to assess due to Pt not communicating.) Did patient suffer from severe childhood neglect?:  (Unable to assess due to Pt not communicating.) Has patient ever been sexually abused/assaulted/raped as an adolescent or adult?:  (Unable to assess due to Pt not communicating.) Was the patient ever a victim of a crime or a disaster?:  (Unable to assess due to Pt not communicating.) Witnessed domestic violence?:  (Unable to assess due to Pt not communicating.) Has patient been affected by domestic violence as an adult?:  (Unable to assess due to Pt not communicating.)  Child/Adolescent Assessment:     CCA Substance Use Alcohol/Drug Use: Alcohol / Drug Use Pain Medications: see MAR Prescriptions: see MAR Over the Counter: see MAR History of alcohol / drug use?: Yes                         ASAM's:  Six Dimensions of Multidimensional Assessment  Dimension 1:  Acute Intoxication and/or Withdrawal Potential:      Dimension 2:  Biomedical  Conditions and Complications:      Dimension 3:  Emotional, Behavioral, or Cognitive Conditions and Complications:     Dimension 4:  Readiness to Change:     Dimension 5:  Relapse, Continued use, or Continued Problem Potential:     Dimension 6:  Recovery/Living Environment:     ASAM Severity Score:    ASAM Recommended Level of Treatment:     Substance use Disorder (SUD)    Recommendations for Services/Supports/Treatments:    Discharge Disposition:    DSM5 Diagnoses: Patient Active Problem List   Diagnosis Date Noted   Homelessness 06/26/2020   Brief psychotic disorder (HCC)    Unspecified mood (affective) disorder (HCC) 04/10/2020     Referrals to Alternative Service(s): Referred to Alternative Service(s):   Place:   Date:   Time:    Referred to Alternative Service(s):   Place:   Date:   Time:    Referred to Alternative Service(s):   Place:   Date:   Time:    Referred to Alternative Service(s):   Place:   Date:   Time:     Pamalee Leyden, [facility]

## 2020-10-07 NOTE — ED Notes (Addendum)
Pt arrived to room, no vocalization when asked orientation questions, but does follow commands. Pt's top clothes removed, and placed in gown and monitoring. Dad present in room, and he is concerned that she is dealing with some sort of recent trauma, or drug use. No response from pt when asked if SI present

## 2020-10-07 NOTE — ED Provider Notes (Signed)
Emergency Medicine Provider Triage Evaluation Note  Kiara [name] , a 20 y.o. female  was evaluated in triage.  Pt complains of abnormal behavior.  History provided by patient's father.  He states patient has been acting odd.  Will fall asleep or not respond to him.  He states several times of the past couple days she is foaming at the mouth and appears to be vomiting what looks like chalk or toothpaste.  Patient will not participate in the history at all.  Review of Systems  Positive: Abnormal behavior Negative: pain  Physical Exam  BP 132/84 (BP Location: Right Arm)   Pulse 77   Temp 98.6 F (37 C) (Oral)   Resp 14   Wt 53.1 kg   LMP  (LMP Unknown)   SpO2 100%   BMI 20.74 kg/m  Gen:   Awake, no distress.  Patient not talking voluntarily.  Will open up her eyes occasionally, would not follow any commands.  Occasionally smiles. Resp:  Normal effort  MSK:   Moves extremities without difficulty  Other:  No ttp of abd  Medical Decision Making  Medically screening exam initiated at 2:08 PM.  Appropriate orders placed.  Kiara [name] was informed that the remainder of the evaluation will be completed by another provider, this initial triage assessment does not replace that evaluation, and the importance of remaining in the ED until their evaluation is complete.  Screening labs   Kiara Apley, PA-C 10/07/20 1410    Cathren Laine, MD 10/07/20 [phone]

## 2020-10-07 NOTE — ED Notes (Signed)
TTS in progress 

## 2020-10-08 ENCOUNTER — Other Ambulatory Visit: Payer: Self-pay

## 2020-10-08 ENCOUNTER — Emergency Department (HOSPITAL_COMMUNITY): Payer: Medicaid Other

## 2020-10-08 LAB — CK: Total CK: 79 U/L (ref 38–234)

## 2020-10-08 LAB — RESP PANEL BY RT-PCR (FLU A&B, COVID) ARPGX2
Influenza A by PCR: NEGATIVE
Influenza B by PCR: NEGATIVE
SARS Coronavirus 2 by RT PCR: NEGATIVE

## 2020-10-08 MED ORDER — DIPHENHYDRAMINE HCL 50 MG/ML IJ SOLN: 25.0000 mg | Freq: Two times a day (BID) | INTRAMUSCULAR | Status: DC | PRN

## 2020-10-08 MED ORDER — ZIPRASIDONE MESYLATE 20 MG IM SOLR
10.0000 mg | Freq: Two times a day (BID) | INTRAMUSCULAR | Status: DC | PRN
  Filled 2020-10-08: qty 20

## 2020-10-08 MED ORDER — OLANZAPINE 5 MG PO TBDP
5.0000 mg | ORAL_TABLET | Freq: Every day | ORAL | Status: DC
  Administered 2020-10-08 – 2020-10-10 (×3): 5 mg via ORAL
  Filled 2020-10-08 (×3): qty 1

## 2020-10-08 MED ORDER — LORAZEPAM 2 MG/ML IJ SOLN
1.0000 mg | Freq: Two times a day (BID) | INTRAMUSCULAR | Status: DC | PRN
  Filled 2020-10-08: qty 1

## 2020-10-08 MED ORDER — ZIPRASIDONE MESYLATE 20 MG IM SOLR
10.0000 mg | Freq: Two times a day (BID) | INTRAMUSCULAR | Status: DC | PRN
  Administered 2020-10-08: 10 mg via INTRAMUSCULAR

## 2020-10-08 MED ORDER — LORAZEPAM 2 MG/ML IJ SOLN: 1.0000 mg | Freq: Two times a day (BID) | INTRAMUSCULAR | Status: DC | PRN

## 2020-10-08 NOTE — ED Notes (Signed)
Pt starting to step out of room more frequently, asking how long she will be here at the hospital, etc. Redirectable, and compliant with instructions to return to room at this time.

## 2020-10-08 NOTE — Progress Notes (Signed)
Inpatient Behavioral Health Placement  Pt meets inpatient criteria per Dorena Bodo, NP.  Per Joslyn Devon, RN [facility] there are no appropriate beds at [facility]. Referral was sent to the following out of network facilities:  [facility] Provider Address Phone Fax  [facility]  [address], [place] [address] [phone]  [facility]  [address] [place] [address] [phone]  [facility]  [address] [place] [address] [phone]  [facility]  [address], [place] [address] [phone]  [facility]  [address], [place] [address] [phone]  [facility]  [address] [place] [address] [phone]  [facility]  [address] [place] [address] [phone]  [facility] [address]  [address], [place] [address] [phone]     Situation ongoing,  CSW will follow up.   Maryjean Ka, MSW, [facility] 10/08/2020  @ 11:17 PM

## 2020-10-08 NOTE — ED Notes (Signed)
Pt becoming agitated and pacing room. Meds given per [facility]

## 2020-10-08 NOTE — ED Notes (Signed)
Patient walked to bathroom.

## 2020-10-08 NOTE — ED Notes (Signed)
Patient holding call bell to her right ear and talking/laugh like she is speaking with someone.

## 2020-10-08 NOTE — ED Notes (Signed)
Patient transported to CT 

## 2020-10-08 NOTE — ED Notes (Signed)
Patient walked to the restroom on her own, when she came out of the bathroom, she had urinated on herself and needed her pants changed. Helped her change pants, asked her questions and she just had a blank look and would not speak. Charm Barges, EDP aware.

## 2020-10-08 NOTE — Consult Note (Signed)
Kiara [name] is 20 year old female seen by TTS and consulted with [name], NP earlier on 10/08/20. This patient meets criteria for inpatient psychiatric treatment. Ativan ordered for catatonia.   Per Dr. 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   CT Head FINDINGS: Brain: No acute intracranial abnormality. Specifically, no hemorrhage, hydrocephalus, mass lesion, acute infarction, or significant intracranial injury.   Vascular: No hyperdense vessel or unexpected calcification.   Skull: No acute calvarial abnormality.   Sinuses/Orbits: No acute findings   Other: None   IMPRESSION: Normal study.  Zyprexa zydis 5 mg PO QD ordered- psychosis  EKG 9/27:  QT/QTc 377/427  Disposition: Meets criteria for inpatient psychiatric treatment. Psych to follow daily while awaiting inpatient bed placement.

## 2020-10-08 NOTE — ED Notes (Signed)
Father came to visit Wants to be called when pt is d/c

## 2020-10-08 NOTE — ED Notes (Signed)
Patient's boyfriend called Alfonso Patten to speak with patient. RN went into room and asked patient if she wanted to talk. Patient lifted her head up for a moment and starred an Charity fundraiser with no response and went back to sleep.

## 2020-10-08 NOTE — ED Notes (Addendum)
Patient out of room saying she needs to use the bathroom, which she had already done not long ago.  When patient came out of bathroom she turned in the direction not towards her room, sitter walking behind patient.  Security was called to get patient back to room.  When she heard security called she turned back towards room and we had another discussion about her IVC and what needs to happen before she can leave.  Patient verbalized understanding.

## 2020-10-08 NOTE — Consult Note (Signed)
Kiara [name] is a 20 y.o. female who presents to [name] ED accompanied by her father Marshea Wisher. Chart review indicates that the patient has a history of mood disorder, psychotic symptoms, and substance use. Patient has been evaluated at [facility] twice in the past week for similar symptoms. In both evaluations, patient would not respond to any questions. In evaluation on 10/01/2020, pulled her hoodie over her head and was laughing but did not respond to questions. Currently patient is living with her sister. She was last psychiatrically hospitalized on March 2022 at [facility].  TTS Assessment: During assessment, Pt would not respond verbally to anyone. She kept her eyes closed and made no acknowledgement of anyone in the room. Pt's father says Pt has been acting odd for several days and will not respond. He says several times of the past couple days she is foaming at the mouth and appears to be vomiting what looks like chalk or toothpaste. He says Pt has been drinking fluids and eating small amounts. He reports she has been going to the bathroom but recently had a couple of incidents of urinating on herself. Pt's father suspects there is something neurologically wrong or that the Pt has used drugs. Pt's urine drug screen is negative.  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 Placed one time order for ativan 2 mg oral or IM.

## 2020-10-08 NOTE — ED Notes (Signed)
Pt changed out to wine scrubs, all belongings placed in bag at nurse's station in cabinet labeled 16-22.

## 2020-10-08 NOTE — ED Notes (Signed)
DSS is the Interm Guarding at this time and any consents that need to be signed need to go through them. They were put in place Aug 12,2022  Please call Lebron Conners at  [phone]

## 2020-10-08 NOTE — ED Notes (Signed)
Patient ate breakfast, now stating she is ready to leave.  Educated patient that she is here under IVC and has to have her TTS.

## 2020-10-08 NOTE — ED Provider Notes (Signed)
Emergency Medicine Observation Re-evaluation Note  Laurren Lepkowski is a 20 y.o. female, seen on rounds today.  Pt initially presented to the ED for complaints of Emesis and Altered Mental Status Currently, the patient is awake and alert not communicating.  Physical Exam  BP 97/68   Pulse 82   Temp 98.2 F (36.8 C) (Oral)   Resp 18   Wt 62.4 kg   LMP  (LMP Unknown)   SpO2 100%   BMI 24.36 kg/m  Physical Exam General: No acute distress Cardiac: Well-perfused Lungs: Nonlabored Psych: Not interacting  ED Course / MDM  EKG:EKG Interpretation  Date/Time:  Tuesday October 08 2020 00:10:06 EDT Ventricular Rate:  77 PR Interval:  161 QRS Duration: 89 QT Interval:  377 QTC Calculation: 427 R Axis:   -1 Text Interpretation: Sinus rhythm Low voltage, precordial leads Confirmed by Tanda Rockers ([id number]) on 10/08/2020 2:02:50 AM  I have reviewed the labs performed to date as well as medications administered while in observation.  Recent changes in the last 24 hours include psychiatric evaluation.  Plan  Current plan is for inpatient bed placement. Dechelle Attaway is under involuntary commitment.      Terrilee Files, MD 10/08/20 [phone]

## 2020-10-08 NOTE — ED Notes (Signed)
Patient was given a [place] sandwich and apple juice

## 2020-10-08 NOTE — ED Notes (Signed)
Report given to Alvino Chapel, RN, patient moved to room 27 and 2 label belongings bags placed in locker 27.  IVC paperwork given to Alvino Chapel, Charity fundraiser.

## 2020-10-09 NOTE — ED Notes (Signed)
Patient is currently sleeping. VS will be obtained once awake

## 2020-10-09 NOTE — BH Assessment (Signed)
[facility] Assessment Progress Note  Per Dorena Bodo, NP, this pt requires psychiatric hospitalization at this time.  Pt presents under IVC initiated by EDP Tanda Rockers, DO.  At the directive of Nelly Rout, MD, this writer has sought placement for pt at facilities outside of the [facility] system.  The following facilities have been contacted to seek placement for this pt, with results as noted:  Beds available, information sent, decision pending: [facility], [place] Behavioral Health Coordinator [phone]

## 2020-10-09 NOTE — ED Notes (Signed)
Pt ambulatory independently without issue. Pt not verbally communicating this AM. Able to follow commands without issue. No s/sx of acute distress.

## 2020-10-09 NOTE — ED Notes (Signed)
Pt cont not speaking to staff. Able to follow commands, cooperative at this time.

## 2020-10-09 NOTE — ED Provider Notes (Signed)
Accepted tomorrow morning to inpt at 0800.  Dr. Guss Bunde accepting.   Gwyneth Sprout, MD 10/09/20 2116

## 2020-10-09 NOTE — Progress Notes (Signed)
Pt was accepted to [facility] 10/10/20 after 0800am to [address].   Pt meets inpatient criteria per Dorena Bodo, NP  Attending Physician will be Dr. Margarita Rana  Report can be called to: [phone]  Pt can arrive after 0800am  CSW spoke with Broadus John from [facility]. Pt accepted to [facility] tomorrow after 0800am to [address]. Pt meets inpatient criteria per Dorena Bodo, NP. Attending Physician will be Dr. Margarita Rana. Report can be called to [phone]. Please fax IVC paperwork to [phone].  Care team notified via secure chat Bertram Millard, RN. CSW requested that nursing add EDP into chat for updated information on pt's disposition.    Kelton Pillar, LCSWA 10/09/2020 @ 9:06 PM

## 2020-10-09 NOTE — Consult Note (Signed)
Kiara [name] is 20 year old female seen face to face at [facility] Patient is non verbal with interview. No eye contact. Non verbal with nursing staff. This patient lying in bed with sheets over her head.   Disposition:  continues to meet criteria for inpatient psychiatric treatment due to psychosis. Staff monitor for safety.

## 2020-10-10 NOTE — BH Assessment (Addendum)
[facility] Assessment Progress Note   Per Dorena Bodo, NP, this pt requires psychiatric hospitalization at this time.  Pt presents under IVC.  Pt has reportedly been accepted to [facility] by Dr Margarita Rana; for details please see Douglass Rivers note authored on 10/09/2020 at 21:06.  EDP Stephan Minister, MD and pt's nurse, Waynetta Sandy, have been notified, and Waynetta Sandy agrees to call report to [phone].  Pt is to be transported via [facility].  At 10:47 this Clinical research associate called pt's temporary legal guardian, Lebron Conners, to notify her of disposition.  Call rolled to voice mail and I left a message.  I have also made several attempts to fax letter of guardianship to [facility], so far without success.  Will keep trying.   Doylene Canning Behavioral Health Coordinator [phone]   Addendum:  At 11:39 Broadus John at [facility] confirms receipt.  Doylene Canning, [place] Behavioral Health Coordinator [phone]

## 2020-10-10 NOTE — ED Notes (Signed)
Pt off unit to facility per provider.  Pt alert, cooperative, calm, no s/s of distress. DC information and belongings given to sheriff for transport. Pt ambulatory off unit, escorted and transported by sheriff.

## 2020-10-10 NOTE — ED Provider Notes (Signed)
Emergency Medicine Observation Re-evaluation Note  Kiara [name] is a 20 y.o. female, seen on rounds today.  Pt initially presented to the ED for complaints of Emesis and Altered Mental Status Currently, the patient is resting comfortably.  Physical Exam  BP (!) 92/50 (BP Location: Left Arm)   Pulse 89   Temp 98.8 F (37.1 C) (Oral)   Resp 18   Wt 62.4 kg   LMP 10/02/2020   SpO2 97%   BMI 24.36 kg/m  Physical Exam General: Nontoxic appearance Cardiac: Normal heart rate Lungs: Normal respiratory Psych: Not responding to internal stimuli  ED Course / MDM  EKG:EKG Interpretation  Date/Time:  Tuesday October 08 2020 00:10:06 EDT Ventricular Rate:  77 PR Interval:  161 QRS Duration: 89 QT Interval:  377 QTC Calculation: 427 R Axis:   -1 Text Interpretation: Sinus rhythm Low voltage, precordial leads Confirmed by Ross Marcus ([id number]) on 10/09/2020 2:55:45 PM  I have reviewed the labs performed to date as well as medications administered while in observation.  Recent changes in the last 24 hours include accepted for admission to psychiatric hospital.  Plan  Current plan is for transfer, now. Kiara [name] is under involuntary commitment.      Mancel Bale, MD 10/10/20 1041

## 2021-01-24 ENCOUNTER — Emergency Department (HOSPITAL_COMMUNITY)
Admission: EM | Admit: 2021-01-24 | Discharge: 2021-01-24 | Disposition: A | Discharge status: Home / Self Care | Payer: Medicaid Other | Attending: Student | Admitting: Student

## 2021-01-24 ENCOUNTER — Encounter (HOSPITAL_COMMUNITY): Payer: Self-pay | Admitting: *Deleted

## 2021-01-24 ENCOUNTER — Other Ambulatory Visit: Payer: Self-pay

## 2021-01-24 DIAGNOSIS — Z5321 Procedure and treatment not carried out due to patient leaving prior to being seen by health care provider: Secondary | ICD-10-CM | POA: Diagnosis not present

## 2021-01-24 DIAGNOSIS — Z79899 Other long term (current) drug therapy: Secondary | ICD-10-CM | POA: Diagnosis not present

## 2021-01-24 DIAGNOSIS — Z046 Encounter for general psychiatric examination, requested by authority: Secondary | ICD-10-CM | POA: Diagnosis present

## 2021-01-24 NOTE — ED Triage Notes (Signed)
Patient refuses to speak with RN at triage, refer to PA notes.

## 2021-01-24 NOTE — ED Provider Triage Note (Signed)
Emergency Medicine Provider Triage Evaluation Note  Kiara [name] , a 21 y.o. female  was evaluated in triage.  Pt complains of medical clearance. Patient asked if there was anyway we could put her to sleep to kill her; however denies SI? Denies HI and auditory/visual hallucinations. No physical complaints. History of psychiatric disorders per chart review. On Zyprexa.   Review of Systems  Positive: SI Negative: HI  Physical Exam  BP (!) 120/93 (BP Location: Right Arm)    Pulse 83    Temp 98.2 F (36.8 C) (Oral)    Resp 18    LMP 01/24/2021    SpO2 97%  Gen:   Awake, no distress   Resp:  Normal effort  MSK:   Moves extremities without difficulty  Other:    Medical Decision Making  Medically screening exam initiated at 8:44 AM.  Appropriate orders placed.  Kiara [name] was informed that the remainder of the evaluation will be completed by another provider, this initial triage assessment does not replace that evaluation, and the importance of remaining in the ED until their evaluation is complete.  Medical clearance labs ordered   Jesusita Oka 01/24/21 7824

## 2021-01-24 NOTE — ED Notes (Signed)
Pt ran out of the department prior to completing IVC papers. Request gpd to send officers to retrieve her. Provider is aware that pt eloped.

## 2021-01-24 NOTE — ED Notes (Signed)
When patient spoke with PA, she requested to be "put to sleep" when questioned further, she was requesting to be euthanized.she then proceeded to run out the door, had security retrieve her while IVC papers are completed.

## 2021-01-24 NOTE — ED Provider Notes (Signed)
Patient attempted to leave the department. IVC paperwork filled out.    Suzy Bouchard, PA-C 01/24/21 [id number]    Lajean Saver, MD 01/24/21 [phone]

## 2021-02-01 ENCOUNTER — Ambulatory Visit (HOSPITAL_COMMUNITY)
Admission: EM | Admit: 2021-02-01 | Discharge: 2021-02-03 | Disposition: A | Discharge status: Other Acute Inpatient Hospital | Payer: Medicaid Other | Attending: Urology | Admitting: Urology

## 2021-02-01 ENCOUNTER — Other Ambulatory Visit: Payer: Self-pay

## 2021-02-01 ENCOUNTER — Encounter (HOSPITAL_COMMUNITY): Payer: Self-pay | Admitting: Emergency Medicine

## 2021-02-01 DIAGNOSIS — F23 Brief psychotic disorder: Secondary | ICD-10-CM | POA: Insufficient documentation

## 2021-02-01 DIAGNOSIS — R45851 Suicidal ideations: Secondary | ICD-10-CM | POA: Insufficient documentation

## 2021-02-01 DIAGNOSIS — Z20822 Contact with and (suspected) exposure to covid-19: Secondary | ICD-10-CM | POA: Insufficient documentation

## 2021-02-01 LAB — RESP PANEL BY RT-PCR (FLU A&B, COVID) ARPGX2
Influenza A by PCR: NEGATIVE
Influenza B by PCR: NEGATIVE
SARS Coronavirus 2 by RT PCR: NEGATIVE

## 2021-02-01 MED ORDER — MAGNESIUM HYDROXIDE 400 MG/5ML PO SUSP: 30.0000 mL | Freq: Every day | ORAL | Status: DC | PRN

## 2021-02-01 MED ORDER — TRAZODONE HCL 50 MG PO TABS: 50.0000 mg | ORAL_TABLET | Freq: Every evening | ORAL | Status: DC | PRN

## 2021-02-01 MED ORDER — LORAZEPAM 1 MG PO TABS
1.0000 mg | ORAL_TABLET | Freq: Once | ORAL | Status: AC
  Filled 2021-02-01: qty 1

## 2021-02-01 MED ORDER — OLANZAPINE 10 MG IM SOLR
INTRAMUSCULAR | Status: AC
  Filled 2021-02-01: qty 10

## 2021-02-01 MED ORDER — LORAZEPAM 2 MG/ML IJ SOLN
1.0000 mg | Freq: Once | INTRAMUSCULAR | Status: AC
  Administered 2021-02-02: 1 mg via INTRAMUSCULAR
  Filled 2021-02-01: qty 1

## 2021-02-01 MED ORDER — OLANZAPINE 10 MG IM SOLR
5.0000 mg | Freq: Once | INTRAMUSCULAR | Status: AC
  Administered 2021-02-01: 5 mg via INTRAMUSCULAR

## 2021-02-01 MED ORDER — ALUM & MAG HYDROXIDE-SIMETH 200-200-20 MG/5ML PO SUSP: 30.0000 mL | ORAL | Status: DC | PRN

## 2021-02-01 MED ORDER — ZIPRASIDONE MESYLATE 20 MG IM SOLR
20.0000 mg | Freq: Two times a day (BID) | INTRAMUSCULAR | Status: DC | PRN
  Administered 2021-02-02: 20 mg via INTRAMUSCULAR
  Filled 2021-02-01 (×2): qty 20

## 2021-02-01 MED ORDER — OLANZAPINE 7.5 MG PO TABS
15.0000 mg | ORAL_TABLET | Freq: Once | ORAL | Status: AC
  Administered 2021-02-03: 15 mg via ORAL
  Filled 2021-02-01 (×2): qty 2

## 2021-02-01 MED ORDER — OLANZAPINE 5 MG PO TBDP
5.0000 mg | ORAL_TABLET | Freq: Two times a day (BID) | ORAL | Status: DC
  Filled 2021-02-01 (×3): qty 1

## 2021-02-01 MED ORDER — OLANZAPINE 10 MG IM SOLR: 5.0000 mg | Freq: Once | INTRAMUSCULAR | Status: DC

## 2021-02-01 MED ORDER — LORAZEPAM 2 MG/ML IJ SOLN
1.0000 mg | Freq: Once | INTRAMUSCULAR | Status: DC
  Administered 2021-02-01: 1 mg via INTRAMUSCULAR

## 2021-02-01 MED ORDER — ACETAMINOPHEN 325 MG PO TABS: 650.0000 mg | ORAL_TABLET | Freq: Four times a day (QID) | ORAL | Status: DC | PRN

## 2021-02-01 MED ORDER — HYDROXYZINE HCL 25 MG PO TABS: 25.0000 mg | ORAL_TABLET | Freq: Three times a day (TID) | ORAL | Status: DC | PRN

## 2021-02-01 MED ORDER — OLANZAPINE 5 MG PO TBDP
5.0000 mg | ORAL_TABLET | Freq: Two times a day (BID) | ORAL | Status: DC
Start: 2021-02-01 — End: 2021-02-01

## 2021-02-01 MED ORDER — LORAZEPAM 0.5 MG PO TABS
0.5000 mg | ORAL_TABLET | Freq: Once | ORAL | Status: DC
  Filled 2021-02-01: qty 1

## 2021-02-01 MED ORDER — LORAZEPAM 2 MG/ML IJ SOLN: 1.0000 mg | Freq: Once | INTRAMUSCULAR | Status: AC

## 2021-02-01 MED ORDER — LORAZEPAM 1 MG PO TABS: 1.0000 mg | ORAL_TABLET | Freq: Once | ORAL | Status: AC

## 2021-02-01 MED ORDER — DIPHENHYDRAMINE HCL 50 MG/ML IJ SOLN
25.0000 mg | Freq: Once | INTRAMUSCULAR | Status: AC
  Administered 2021-02-01: 25 mg via INTRAMUSCULAR
  Filled 2021-02-01: qty 1

## 2021-02-01 MED ORDER — LORAZEPAM 2 MG/ML IJ SOLN
INTRAMUSCULAR | Status: AC
  Filled 2021-02-01: qty 1

## 2021-02-01 MED ORDER — OLANZAPINE 5 MG PO TBDP: 5.0000 mg | ORAL_TABLET | Freq: Two times a day (BID) | ORAL | Status: DC

## 2021-02-01 NOTE — ED Notes (Signed)
Pt A&O x 4, ambulatory to bath room gait steady, no distress noted. Comfort measures given, food & fluids offered.  Pt requested, pencil and paper.  Monitoring for safety.  Pt remains IVC.
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
## 2021-02-01 NOTE — ED Notes (Addendum)
Pt refused VS  

## 2021-02-01 NOTE — Progress Notes (Signed)
Ann with [facility] in reference to reviewing this patient for placement. Ann requested additional information as noted: CMP, Pregnancy test, UDS,medical clearance note, and IVC paperwork to be faxed to fax#: [phone]. CSW will inform nursing staff and provider of request via secure chat.  Crissie Reese, MSW, LCSW-A, LCAS-A Phone: [phone] Disposition/TOC
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
## 2021-02-01 NOTE — Progress Notes (Signed)
Per Julaine Fusi, via secure chat, patient meets criteria for inpatient treatment. There are no available or appropriate beds at [facility] today. CSW faxed referrals to the following facilities for review:  [facility]  Pending - No Request Sent N/A [address], [place] [address] [phone] --  [facility]  Pending - No Request Sent N/A [address], [place] [address] [phone] --  [facility]  Pending - No Request [address] [place] [address] [phone] --  [facility]  Pending - No Request Sent N/A [address] [place] [address] [phone] --  [facility]  Pending - No Request Sent N/A [address] [place] [address] [phone] --  [facility]  Pending - No Request Sent N/A [address] [place], [place] [address] [phone] --  [facility]  Pending - No Request Sent N/A [address] [place] [address] [phone] --  [facility]  Pending - No Request Sent N/A [address], [place] [address] [phone] --  [facility]  Pending - No Request Sent N/A [address] [place] [address] [phone] --  [facility]  Pending - No Request Sent N/A [address] [place] [address] [phone] --  [facility]  Pending - No Request Sent N/A [address], [place] [address] [phone] --  [facility]  Pending - No Request Sent N/A [address] [place] [address] [phone] --  [facility]  Pending - No Request Sent N/A [name] [place] [address] [phone]  [phone] --  [facility]  Pending - No Request Sent N/A [address], [place] [address] [phone] --  [facility]  Pending - No Request Sent N/A [address] [place] [address] [phone] --    TTS will continue to seek bed placement.  Crissie Reese, MSW, LCSW-A, LCAS-A Phone: [phone] Disposition/TOC
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
## 2021-02-01 NOTE — ED Notes (Signed)
Pt refused scheduled medication. Pt is asleep with even and unlabored respirations. No signs of distress noted. Will continue to monitor.

## 2021-02-01 NOTE — ED Notes (Addendum)
Pt has been resting in her chair/bed today. Pt refused to talk with pharmacist this morning as well as RN during assessment. Pt was offered food and beverage but also refused. Pt is comfortable, with even and unlabored respirations. No distress noted.

## 2021-02-01 NOTE — ED Notes (Signed)
Pt sleeping at present, no distress noted.  Resp even & unlabored.  Monitoring f or safety.

## 2021-02-01 NOTE — ED Notes (Signed)
Pt asleep with even and unlabored respirations. No distress or discomfort noted. Pt remains safe on the unit. Will continue to monitor. 

## 2021-02-01 NOTE — ED Notes (Signed)
Patient refused Vitals to both Staff.
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
## 2021-02-01 NOTE — ED Notes (Signed)
Pt given IM medication after refusal of PO meds zyprexa and ativan. Medication route changed to IM and Ms Bai refused mediation given with the assist of staff after placement in restraint chair

## 2021-02-01 NOTE — ED Notes (Signed)
Pt is in the bed sleeping. Respirations are even and unlabored. No acute distress noted. Will continue to monitor for safety. 

## 2021-02-02 LAB — COMPREHENSIVE METABOLIC PANEL
ALT: 17 U/L (ref 0–44)
AST: 29 U/L (ref 15–41)
Albumin: 4.1 g/dL (ref 3.5–5.0)
Alkaline Phosphatase: 58 U/L (ref 38–126)
Anion gap: 8 (ref 5–15)
BUN: 9 mg/dL (ref 6–20)
CO2: 24 mmol/L (ref 22–32)
Calcium: 8.7 mg/dL — ABNORMAL LOW (ref 8.9–10.3)
Chloride: 102 mmol/L (ref 98–111)
Creatinine, Ser: 0.74 mg/dL (ref 0.44–1.00)
GFR, Estimated: >60 mL/min (ref >60)
Glucose, Bld: 92 mg/dL (ref 70–99)
Potassium: 3 mmol/L — ABNORMAL LOW (ref 3.5–5.1)
Sodium: 134 mmol/L — ABNORMAL LOW (ref 135–145)
Total Bilirubin: 0.8 mg/dL (ref 0.3–1.2)
Total Protein: 7.1 g/dL (ref 6.5–8.1)

## 2021-02-02 LAB — LIPID PANEL
Cholesterol: 187 mg/dL (ref 0–200)
HDL: 52 mg/dL (ref >40)
LDL Cholesterol: 126 mg/dL — ABNORMAL HIGH (ref 0–99)
Total CHOL/HDL Ratio: 3.6 RATIO
Triglycerides: 46 mg/dL (ref <150)
VLDL: 9 mg/dL (ref 0–40)

## 2021-02-02 LAB — CBC WITH DIFFERENTIAL/PLATELET
Abs Immature Granulocytes: 0.02 10*3/uL (ref 0.00–0.07)
Basophils Absolute: 0 10*3/uL (ref 0.0–0.1)
Basophils Relative: 0 %
Eosinophils Absolute: 0.1 10*3/uL (ref 0.0–0.5)
Eosinophils Relative: 2 %
HCT: 41.3 % (ref 36.0–46.0)
Hemoglobin: 14.1 g/dL (ref 12.0–15.0)
Immature Granulocytes: 0 %
Lymphocytes Relative: 30 %
Lymphs Abs: 1.5 10*3/uL (ref 0.7–4.0)
MCH: 29.7 pg (ref 26.0–34.0)
MCHC: 34.1 g/dL (ref 30.0–36.0)
MCV: 86.9 fL (ref 80.0–100.0)
Monocytes Absolute: 0.6 10*3/uL (ref 0.1–1.0)
Monocytes Relative: 12 %
Neutro Abs: 2.8 10*3/uL (ref 1.7–7.7)
Neutrophils Relative %: 56 %
Platelets: 264 10*3/uL (ref 150–400)
RBC: 4.75 MIL/uL (ref 3.87–5.11)
RDW: 13.1 % (ref 11.5–15.5)
WBC: 5.1 10*3/uL (ref 4.0–10.5)
nRBC: 0 % (ref 0.0–0.2)

## 2021-02-02 LAB — TSH: TSH: 0.93 u[IU]/mL (ref 0.350–4.500)

## 2021-02-02 LAB — POCT URINE DRUG SCREEN - MANUAL ENTRY (I-SCREEN)
POC Amphetamine UR: NOT DETECTED
POC Buprenorphine (BUP): NOT DETECTED
POC Cocaine UR: NOT DETECTED
POC Marijuana UR: POSITIVE — AB
POC Methadone UR: NOT DETECTED
POC Methamphetamine UR: NOT DETECTED
POC Morphine: NOT DETECTED
POC Oxazepam (BZO): POSITIVE — AB
POC Oxycodone UR: NOT DETECTED
POC Secobarbital (BAR): NOT DETECTED

## 2021-02-02 LAB — PREGNANCY, URINE: Preg Test, Ur: NEGATIVE

## 2021-02-02 LAB — POC SARS CORONAVIRUS 2 AG -  ED: SARS Coronavirus 2 Ag: NEGATIVE

## 2021-02-02 MED ORDER — POTASSIUM CHLORIDE CRYS ER 20 MEQ PO TBCR
20.0000 meq | EXTENDED_RELEASE_TABLET | Freq: Every day | ORAL | Status: DC
  Administered 2021-02-02: 20 meq via ORAL
  Filled 2021-02-02 (×3): qty 1

## 2021-02-02 NOTE — ED Notes (Addendum)
Pt sleeping at present, no distress noted. Respirations even & unlabored.  Monitoring for safety. 

## 2021-02-02 NOTE — ED Notes (Signed)
Pt reached through the window at the nurses desk and used the hand sanitizer while this nurse was typing on the computer to the side of the counter. Informed Pt that she was not to put her hands through the window anymore and that she had already been told not to do such a thing.

## 2021-02-02 NOTE — ED Notes (Signed)
Pt put another patients shoes on. Staff redirected and she removed the other patients shoes.

## 2021-02-02 NOTE — ED Notes (Signed)
Pt currently on the phone. Hyperverbal and pressured speech.  Pt continues to be redirectable.  Awaiting dispo.

## 2021-02-02 NOTE — ED Notes (Signed)
Pt currently in bathroom taking a shower.  Will continue to monitor for safety.

## 2021-02-02 NOTE — ED Notes (Signed)
Pt is awake and alert.  She has been pacing in the milieu but is redirectable.   Pt denies SI, HI or AVH.  Will continue to monitor for safety.

## 2021-02-02 NOTE — Progress Notes (Signed)
Per Kiara [name], patient meets criteria for inpatient treatment. There are no available or appropriate beds at [facility] today. CSW faxed referrals to the following facilities for review:  [facility]  Pending - Request Sent N/A [address], [place] [address] [phone] --  [facility]  Pending - Request Sent N/A [address], [place] [address] [phone] --  [facility]  Pending - Request Sent N/A [facility] [address] [place] [address] [phone] --  [facility]  Pending - Request Sent N/A [address] [place] [address] [phone] --  [facility]  Pending - Request Sent N/A [address] [place] [address] [phone] --  [facility]  Pending - Request Sent N/A [address] [place], [place] [address] [phone] --  [facility]  Pending - Request Sent N/A [address] [place] [address] [phone] --  [facility]  Pending - Request Sent N/A [address], [place] [address] [phone] --  [facility]  Pending - Request Sent N/A [address], [place] [address] [phone] --  [facility]  Pending - Request Sent N/A [address] [place] [address] [phone] --  [facility]  Pending - Request Sent N/A [address], [place] [address] [phone] --  [facility]  Pending - Request Sent N/A [address] [place] [address] [phone] --  [facility]  Pending - Request Sent N/A [address] [place] [address] [phone] --  [facility]  Pending - Request  Sent N/A [address] [place] [address] [phone] --  [facility]  Pending - Request Sent N/A [address] [place] [address] [phone] --    TTS will continue to seek bed placement.  Kiara [name], MSW, LCSW-A, LCAS-A Phone: [phone] Disposition/TOC

## 2021-02-02 NOTE — ED Notes (Signed)
Pt awoke with little prompting.  She was offered her morning zyprexa but refused to take it.  Pt appeared to fall back asleep with no distress noted.

## 2021-02-02 NOTE — ED Notes (Signed)
Pt pacing and exercising on the floor, laughing inappropriately and punching the air.  Pt pending IM injections for agitation.  Staff at bedside for assistance.

## 2021-02-02 NOTE — ED Notes (Signed)
Pt currently sitting on the floor singings and appears to be RIS.   Will continue to observe.  Pt refused another offer of  meds.

## 2021-02-02 NOTE — ED Notes (Signed)
Pt sleeping at present, no distress noted. Calm at present.  Monitoring for safety.

## 2021-02-02 NOTE — ED Notes (Signed)
Pt resting quietly at this time.  Breathing even and unlabored.  Will monitor for safety.

## 2021-02-02 NOTE — ED Notes (Signed)
Pt awake, alert & responsive, no distress noted.  Sleeping at present.  Calm at present, Pt is IVC.  Monitoring for safety.

## 2021-02-02 NOTE — ED Notes (Signed)
Pt tolerated injections without resistance, staff at bedside for assistance as needed.

## 2021-02-02 NOTE — ED Notes (Signed)
Pt currently resting quietly without complaints.  No distress noted.  Will continue to monitor for safety.

## 2021-02-02 NOTE — ED Notes (Signed)
Pt awake & restless at present.  No distress noted.  Monitoring for safety.
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
## 2021-02-03 ENCOUNTER — Emergency Department (HOSPITAL_COMMUNITY)
Admission: EM | Admit: 2021-02-03 | Discharge: 2021-02-07 | Disposition: A | Discharge status: Home / Self Care | Payer: Medicaid Other | Attending: Emergency Medicine | Admitting: Emergency Medicine

## 2021-02-03 DIAGNOSIS — F23 Brief psychotic disorder: Secondary | ICD-10-CM | POA: Insufficient documentation

## 2021-02-03 DIAGNOSIS — Z5900 Homelessness unspecified: Secondary | ICD-10-CM | POA: Insufficient documentation

## 2021-02-03 DIAGNOSIS — F29 Unspecified psychosis not due to a substance or known physiological condition: Secondary | ICD-10-CM

## 2021-02-03 DIAGNOSIS — R456 Violent behavior: Secondary | ICD-10-CM | POA: Diagnosis not present

## 2021-02-03 LAB — PROLACTIN: Prolactin: 36.3 ng/mL — ABNORMAL HIGH (ref 4.8–23.3)

## 2021-02-03 MED ORDER — CALCIUM CARBONATE ANTACID 500 MG PO CHEW
1.0000 | CHEWABLE_TABLET | Freq: Once | ORAL | Status: DC
  Filled 2021-02-03: qty 1

## 2021-02-03 MED ORDER — POTASSIUM CHLORIDE CRYS ER 20 MEQ PO TBCR
40.0000 meq | EXTENDED_RELEASE_TABLET | Freq: Once | ORAL | Status: DC
  Filled 2021-02-03: qty 2

## 2021-02-03 MED ORDER — STERILE WATER FOR INJECTION IJ SOLN
INTRAMUSCULAR | Status: AC
  Administered 2021-02-03: 1.2 mL
  Filled 2021-02-03: qty 10

## 2021-02-03 MED ORDER — ZIPRASIDONE MESYLATE 20 MG IM SOLR
20.0000 mg | Freq: Once | INTRAMUSCULAR | Status: AC
  Administered 2021-02-03: 20 mg via INTRAMUSCULAR
  Filled 2021-02-03: qty 20

## 2021-02-03 NOTE — ED Triage Notes (Signed)
Patient transfer from [facility].  Patient IVCd

## 2021-02-03 NOTE — Progress Notes (Signed)
Report given to Patty, [place]

## 2021-02-03 NOTE — Progress Notes (Signed)
15 mg Zyprexa PO administered due to increased agitation and pt not being able to redirect. Pt is presently sitting and talking to self. No signs of acute distress noted. Staff will monitor for pt's safety.

## 2021-02-03 NOTE — Progress Notes (Signed)
Kiara [name] to be transferred to [facility] per NP order. Discussed with the patient and all questions fully answered. An After Visit Summary, EMTALA and Med Necessity forms were printed and to be given to the receiving nurse. Patient escorted out and transferred via [employer].  Dickie La  02/03/2021 2:39 PM

## 2021-02-03 NOTE — Progress Notes (Signed)
Pt was agitated and was demanding to see her IVC papers. Pt was informed that she can request for her medical records after discharge. Pt became irate and started banging a book on the nurses station wall. Pt is difficult to redirect at this time. Staff will monitor for pt's safety.
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
## 2021-02-03 NOTE — ED Notes (Signed)
Pt sleeping at present, no distress noted. Respirations even & unlabored.  Monitoring for safety. 

## 2021-02-03 NOTE — Progress Notes (Addendum)
Pt was observed sticking a pencil in her nose. This Clinical research associate took it from her and was redirected. Pt appears to be conversant of her action as she stated " I am going to tell on y'all that I had a pencil." Pt also appears to be RIS as evidenced by pt talking to self and smiling inappropriately. Able to redirect at this time. Staff will continue to monitor for safety.

## 2021-02-03 NOTE — ED Notes (Addendum)
Patient woke up from sleeping aggressive and seriously agitated. Notified Dr. Audley Hose. New order for 20mg  geodan IM ordered and given with security assistance.   After IM geodon given, patient's become more agitated and violent. Dr. notified and new order for violent restraints ordered and violent restraints placed with assistance from security. Dr Audley Hose came very soon after he was notified to assess patient. Will continue to monitor.

## 2021-02-03 NOTE — ED Notes (Signed)
Pt. Is becoming hard to redirect, and getting antsy.

## 2021-02-03 NOTE — ED Provider Notes (Signed)
FBC/OBS ASAP Discharge Summary  Date and Time: 02/03/2021 12:41 PM  Name: Kiara [name]  MRN:  [id number]   Discharge Diagnoses:  Final diagnoses:  Acute psychosis [facility])    Subjective:  Kiara [name], 21 y.o., female patient who presented to [facility] under IVC via law enforcement. She was admitted to the continuous assessment unit for overnight observation. She was reassessed and recommended for inpatient psychiatric admission. She has remained on the unit while awaiting inpatient psychiatric bed availability.  Patient seen face to face by this provider, and consulted with Dr.Laubach; and  chart reviewed on 02/03/21. She has a history of psychotic symptoms, substance abuse, suicidal ideation, and mood disorder.  On today's assessment Kiara [name] is pacing.  She is alert/oriented x4.  She is cooperative at this time however per nursing notes patient has refused morning medications.  When discussing medication compliance with patient she states, "I am not taking those medications, they do not help me, they make things worse".  She makes fleeting eye contact.  Her speech is clear, coherent, with a normal rate and tone.  She is requesting to be discharged.  Explained to patient that she was under IVC and she has been recommended for inpatient psychiatric admission.  States, "this is all a lie my dad's lying on me".  She does not appear to be responding to internal/external stimuli.  Per nursing notes patient was seen earlier talking to herself and putting her hands up in the air. She denies SI/HI/AVH with this Probation officer.  After this assessment was completed. Per nursing note, "Pt is disruptive by singing loud, and banging on things. Pt placed a pillow under her top and punched it for a while. Pt is demanding and hyper-verbal. Pt continues to RIS as evidenced by pt talking to self, laughing and smiling inappropriately". Patient required prn medication which she agreed to take.   Collateral: Kiara [name]  (father and legal guardian).  Informed father that patient would be transferred to the [facility] long ED.  Father has no concerns with patient being transferred.  Reports "Kiara [name] needs help she is in bad shape".  He proceeded to explain some of the findings in the IVC.  States patient will not stay compliant with her medications and each time she stops taking her medications things get bad.  He request to be informed of patients progress.   Stay Summary:   Zacarias Pontes Charge RN was notified but refused patient due to being on "[place] Triage".  [facility] provider Dr. Maryan Rued was notified and accepted patient. Explained that patient has been recommended for IP admission. She has been held in the continuous assessment unit while awaiting bed availability. However, [facility] reached capacity and is unable to admit any patients due to this patient requiring the entire Flex area. Case was discussed with Dr. Serafina Mitchell and the decision was made to transfer patient to the ED.   Total Time spent with patient: 20 minutes  Past Psychiatric History: see h&p Past Medical History: History reviewed. No pertinent past medical history. History reviewed. No pertinent surgical history. Family History: History reviewed. No pertinent family history. Family Psychiatric History: See h&P Social History:  Social History   Substance and Sexual Activity  Alcohol Use Yes     Social History   Substance and Sexual Activity  Drug Use Yes   Types: Marijuana    Social History   Socioeconomic History   Marital status: Single    Spouse name: Not on file   Number  of children: Not on file   Years of education: Not on file   Highest education level: Not on file  Occupational History   Not on file  Tobacco Use   Smoking status: Every Day    Types: Cigarettes   Smokeless tobacco: Never  Vaping Use   Vaping Use: Some days  Substance and Sexual Activity   Alcohol use: Yes   Drug use: Yes    Types: Marijuana   Sexual activity:  Never  Other Topics Concern   Not on file  Social History Narrative   Not on file   Social Determinants of Health   Financial Resource Strain: Not on file  Food Insecurity: Not on file  Transportation Needs: Not on file  Physical Activity: Not on file  Stress: Not on file  Social Connections: Not on file   SDOH:  SDOH Screenings   Alcohol Screen: Low Risk    Last Alcohol Screening Score (AUDIT): 0  Depression (PHQ2-9): Medium Risk   PHQ-2 Score: 14  Financial Resource Strain: Not on file  Food Insecurity: Not on file  Housing: Not on file  Physical Activity: Not on file  Social Connections: Not on file  Stress: Not on file  Tobacco Use: High Risk   Smoking Tobacco Use: Every Day   Smokeless Tobacco Use: Never   Passive Exposure: Not on file  Transportation Needs: Not on file    Tobacco Cessation:  N/A, patient does not currently use tobacco products  Current Medications:  Current Facility-Administered Medications  Medication Dose Route Frequency Provider Last Rate Last Admin   acetaminophen (TYLENOL) tablet 650 mg  650 mg Oral Q6H PRN Ajibola, Ene A, NP       alum & mag hydroxide-simeth (MAALOX/MYLANTA) 200-200-20 MG/5ML suspension 30 mL  30 mL Oral Q4H PRN Ajibola, Ene A, NP       hydrOXYzine (ATARAX) tablet 25 mg  25 mg Oral TID PRN Ajibola, Ene A, NP       magnesium hydroxide (MILK OF MAGNESIA) suspension 30 mL  30 mL Oral Daily PRN Ajibola, Ene A, NP       OLANZapine zydis (ZYPREXA) disintegrating tablet 5 mg  5 mg Oral BID Ajibola, Ene A, NP       potassium chloride SA (KLOR-CON M) CR tablet 20 mEq  20 mEq Oral Daily White, Patrice L, NP   20 mEq at 02/02/21 1747   traZODone (DESYREL) tablet 50 mg  50 mg Oral QHS PRN Ajibola, Ene A, NP       ziprasidone (GEODON) injection 20 mg  20 mg Intramuscular Q12H PRN Ajibola, Ene A, NP   20 mg at 02/02/21 0356   Current Outpatient Medications  Medication Sig Dispense Refill   ARIPiprazole (ABILIFY) 2 MG tablet Take 2 mg  by mouth daily.     FLUoxetine (PROZAC) 40 MG capsule Take 40 mg by mouth daily.     OLANZapine zydis (ZYPREXA) 10 MG disintegrating tablet Take 10 mg by mouth at bedtime.      PTA Medications: (Not in a hospital admission)   Musculoskeletal  Strength & Muscle Tone: within normal limits Gait & Station: normal Patient leans: N/A  Psychiatric Specialty Exam  Presentation  General Appearance: Casual  Eye Contact:Fair  Speech:Clear and Coherent; Normal Rate  Speech Volume:Normal  Handedness:Right   Mood and Affect  Mood:Anxious; Irritable  Affect:Congruent   Thought Process  Thought Processes:Coherent  Descriptions of Associations:Intact  Orientation:Full (Time, Place and Person)  Thought Content:Paranoid Ideation  Diagnosis of Schizophrenia or Schizoaffective disorder in past: -- (patient reports she has been diagnosed with schizophrenia)  Duration of Psychotic Symptoms: Less than six months   Hallucinations:Hallucinations: None  Ideas of Reference:None  Suicidal Thoughts:Suicidal Thoughts: No  Homicidal Thoughts:Homicidal Thoughts: No   Sensorium  Memory:Immediate Fair; Recent Fair; Remote Fair  Judgment:Fair  Insight:Poor   Executive Functions  Concentration:Fair  Attention Span:Fair  Recall:Fair  Fund of Knowledge:Fair  Language:Fair   Psychomotor Activity  Psychomotor Activity:Psychomotor Activity: Restlessness   Assets  Assets:Housing; Leisure Time; Physical Health; Resilience   Sleep  Sleep:Sleep: Fair   No data recorded  Physical Exam  Physical Exam Vitals and nursing note reviewed.  Constitutional:      General: She is not in acute distress.    Appearance: Normal appearance. She is not ill-appearing.  HENT:     Head: Normocephalic.  Eyes:     General:        Right eye: No discharge.        Left eye: No discharge.     Conjunctiva/sclera: Conjunctivae normal.  Cardiovascular:     Rate and Rhythm: Normal rate.   Pulmonary:     Effort: Pulmonary effort is normal.  Musculoskeletal:        General: Normal range of motion.     Cervical back: Normal range of motion.  Skin:    Coloration: Skin is not jaundiced or pale.  Neurological:     Mental Status: She is alert and oriented to person, place, and time.  Psychiatric:        Attention and Perception: Attention and perception normal.        Mood and Affect: Mood is anxious.        Speech: Speech normal.        Behavior: Behavior is uncooperative.        Thought Content: Thought content does not include homicidal or suicidal ideation.        Cognition and Memory: Cognition normal.        Judgment: Judgment is impulsive.   Review of Systems  Constitutional: Negative.   HENT: Negative.    Eyes: Negative.   Respiratory: Negative.    Cardiovascular: Negative.   Musculoskeletal: Negative.   Skin: Negative.   Neurological: Negative.   Psychiatric/Behavioral:  The patient is nervous/anxious.   Blood pressure 122/78, pulse 82, temperature 98.2 F (36.8 C), temperature source Oral, resp. rate 14, last menstrual period 01/24/2021, SpO2 100 %. There is no height or weight on file to calculate BMI.  Demographic Factors:  Adolescent or young adult  Loss Factors: Financial problems/change in socioeconomic status  Historical Factors: Impulsivity  Risk Reduction Factors:   Sense of responsibility to family and Living with another person, especially a relative  Continued Clinical Symptoms:  Severe Anxiety and/or Agitation Depression:   Aggression Impulsivity Currently Psychotic  Cognitive Features That Contribute To Risk:  None    Suicide Risk:  Mild:  Suicidal ideation of limited frequency, intensity, duration, and specificity.  There are no identifiable plans, no associated intent, mild dysphoria and related symptoms, good self-control (both objective and subjective assessment), few other risk factors, and identifiable protective factors,  including available and accessible social support.  Plan Of Care/Follow-up recommendations:  Activity:  as tolerated  Diet:  regular   Disposition:   [facility] to [facility] Dr. Anitra LauthPlunkett accepting provider.   Patient [facility] inpatient admission criteria. [facility] SW notified patient has been faxed out.   Recommendations:  Zyprexa 5 mg QAM and Zyprexa 10 mg QHS.    Zyprexa agitation protocol.   EKG was completed on 02/02/2021 - Normal Sinus rhythm QT/QTC 378/386.   Agitation protocol   Revonda Humphrey, NP 02/03/2021, 12:41 PM

## 2021-02-03 NOTE — Progress Notes (Signed)
Pt is disruptive by singing loud, and banging on things. Pt placed a pillow under her top and punched it for a while. Pt is demanding and hyper-verbal. Pt continues to RIS as evidenced by pt talking to self, laughing and smiling inappropriately. Pt is difficult to redirect at this time. Staff will monitor for pt's safety.

## 2021-02-03 NOTE — Progress Notes (Signed)
Pt refused to take her scheduled Potassium chloride and Zyprexa this AM.

## 2021-02-03 NOTE — Progress Notes (Signed)
Patient has been faxed out due to [facility] not having any appropriate beds. Patient meets [place] inpatient criteria per Darrol Angel, NP. Patient has been faxed out to the following facilities:   [facility]  [address] [place] [address] [phone]  [place] [address] [place] [address] [phone]  [facility]  [address] [place] [address] [place]  [facility]  [address] [place] [address] [phone]  [place]  [place], [place] [address] [phone] [facility]  [address] [place], [place] [address] [phone] [place] [address] [place] [address] [place]  [facility]  [address] [place] [address] [place]  [facility]  [address] [place] [address] [phone]  [facility]  [address] [place] [address] [phone]  [facility]  [address] [place] [address] [phone] [facility]  [address] [place] [address] [phone]  [facility]  [address] [place] [address] [phone] [facility]  [address], [place] [address] [phone]  [facility]  [address] [place] [address] [phone]   Mariea Clonts, MSW, LCSW-A  11:57 AM 02/03/2021

## 2021-02-03 NOTE — ED Notes (Signed)
Pt's father Sherrilynn, [place] [phone] was notified of pt's transfer via phone call

## 2021-02-03 NOTE — Discharge Instructions (Addendum)
Transfer to [facility] Dr. Maryan Rued accepting

## 2021-02-03 NOTE — Progress Notes (Signed)
Pt is presently asleep. Respirations are even and unlabored. No signs of acute distress noted. Staff will monitor for pt's safety.

## 2021-02-03 NOTE — ED Notes (Signed)
Pt called 911. Told them that she was not IVC'd, we are forcing meds on her, that she is room 139C. Explained to 911 operator that Pt is IVC'd and that she is not in a room that she is on the observation unit and that she is refusing to take meds. Safety maintained and will continue to monitor.

## 2021-02-04 ENCOUNTER — Other Ambulatory Visit: Payer: Self-pay

## 2021-02-04 DIAGNOSIS — F23 Brief psychotic disorder: Secondary | ICD-10-CM | POA: Diagnosis not present

## 2021-02-04 LAB — HEMOGLOBIN A1C
Hgb A1c MFr Bld: 5.7 % — ABNORMAL HIGH (ref 4.8–5.6)
Mean Plasma Glucose: 117 mg/dL

## 2021-02-04 MED ORDER — OLANZAPINE 5 MG PO TBDP
5.0000 mg | ORAL_TABLET | Freq: Two times a day (BID) | ORAL | Status: DC
  Filled 2021-02-04 (×2): qty 1

## 2021-02-04 MED ORDER — ZIPRASIDONE MESYLATE 20 MG IM SOLR
20.0000 mg | Freq: Once | INTRAMUSCULAR | Status: DC | PRN
  Filled 2021-02-04: qty 20

## 2021-02-04 MED ORDER — STERILE WATER FOR INJECTION IJ SOLN
INTRAMUSCULAR | Status: AC
  Filled 2021-02-04: qty 10

## 2021-02-04 MED ORDER — POTASSIUM CHLORIDE CRYS ER 20 MEQ PO TBCR
40.0000 meq | EXTENDED_RELEASE_TABLET | Freq: Once | ORAL | Status: AC
  Administered 2021-02-04: 05:00:00 40 meq via ORAL
  Filled 2021-02-04: qty 2

## 2021-02-04 NOTE — Consult Note (Addendum)
[place] Psychiatry Consult   Reason for Consult: Psychiatric evaluation Referring Physician:  Dr. Roderic Palau, [place]  Patient Identification: Kiara [name] MRN:  [id number] Principal Diagnosis: Brief psychotic disorder [facility]) Diagnosis:  Principal Problem:   Brief psychotic disorder ([place]) Active Problems:   Homelessness    Total Time spent with patient: 30 minutes  Subjective:   Kiara [name] is a 21 y.o. female patient admitted with presenting emergency department with psychosis.  Patient originally presented to  [facility]C due to psychosis.  Although patient denies suicidal ideations, homicidal ideations, psychosis, and drug use current clinical presentation and close observation reveals patient is actively psychotic.  Patient is noted to be talking to herself, throwing her body up against the wall, refusing all medications.  Patient presents with hyperverbal and pressured speech, difficult to redirect at times.  While at [employer], patient allegedly called 911, tell them they were forcing meds on her and that she is in room 139C.  Patient is also observed to be punching and hygiene and a pillow, and waving a banana in the air.  She continues to be responding to internal stimuli as noted by talking to self, laughing and smiling inappropriately, even while brushing her teeth.  Patient continues to meet inpatient psychiatric criteria.  Kiara [name], 21 y.o female seen by this provider.  She is alert and oriented to person place situation.  Good eye contact throughout interview, speech normal, volume normal does not appear to be responding to internal or external stimuli. Denies visual hallucinations. Denies suicidal ideation, no intent no plan.  Denies homicidal ideation.  Denies to self injurious behaviors.  Patient does make request to leave, and terminates interview with provider.   HPI:  Kiara [name] is a 21 year  old female presenting under IVC due to psychosis. Patient denied SI, HI, psychosis and alcohol/drug usage. Patient was guarded initially. When asked, what's going on with you today, patient stated "nothing". Patient continued to stare and smile when asked TTS questions. Patient was able to answer "[facility]" when asked, what city are you in". Patient reported being on a mood stabilizer and stated "didn't help", when asked, why did you stop taking psych medications.   Past Psychiatric History: none per patient  Risk to Self:  no Risk to Others:  no Prior Inpatient Therapy:  no Prior Outpatient Therapy:  no  Past Medical History: No past medical history on file. No past surgical history on file. Family History: No family history on file. Family Psychiatric  History: unknown Social History:  Social History   Substance and Sexual Activity  Alcohol Use Yes     Social History   Substance and Sexual Activity  Drug Use Yes   Types: Marijuana    Social History   Socioeconomic History   Marital status: Single    Spouse name: Not on file   Number of children: Not on file   Years of education: Not on file   Highest education level: Not on file  Occupational History   Not on file  Tobacco Use   Smoking status: Every Day    Types: Cigarettes   Smokeless tobacco: Never  Vaping Use   Vaping Use: Some days  Substance and Sexual Activity   Alcohol use: Yes   Drug use: Yes    Types: Marijuana   Sexual activity: Never  Other Topics Concern   Not on file  Social History Narrative   Not on file  Social Determinants of Health   Financial Resource Strain: Not on file  Food Insecurity: Not on file  Transportation Needs: Not on file  Physical Activity: Not on file  Stress: Not on file  Social Connections: Not on file   Additional Social History:    Allergies:  No Known Allergies  Labs:  Results for orders placed or performed during the hospital encounter of 02/01/21 (from the  past 48 hour(s))  CBC with Differential/Platelet     Status: None   Collection Time: 02/02/21 11:34 AM  Result Value Ref Range   WBC 5.1 4.0 - 10.5 K/uL   RBC 4.75 3.87 - 5.11 MIL/uL   Hemoglobin 14.1 12.0 - 15.0 g/dL   HCT 41.3 36.0 - 46.0 %   MCV 86.9 80.0 - 100.0 fL   MCH 29.7 26.0 - 34.0 pg   MCHC 34.1 30.0 - 36.0 g/dL   RDW 13.1 11.5 - 15.5 %   Platelets 264 150 - 400 K/uL   nRBC 0.0 0.0 - 0.2 %   Neutrophils Relative % 56 %   Neutro Abs 2.8 1.7 - 7.7 K/uL   Lymphocytes Relative 30 %   Lymphs Abs 1.5 0.7 - 4.0 K/uL   Monocytes Relative 12 %   Monocytes Absolute 0.6 0.1 - 1.0 K/uL   Eosinophils Relative 2 %   Eosinophils Absolute 0.1 0.0 - 0.5 K/uL   Basophils Relative 0 %   Basophils Absolute 0.0 0.0 - 0.1 K/uL   Immature Granulocytes 0 %   Abs Immature Granulocytes 0.02 0.00 - 0.07 K/uL    Comment: Performed at [facility], [address] [place], [place] [address]  Comprehensive metabolic panel     Status: Abnormal   Collection Time: 02/02/21 11:34 AM  Result Value Ref Range   Sodium 134 (L) 135 - 145 mmol/L   Potassium 3.0 (L) 3.5 - 5.1 mmol/L   Chloride 102 98 - 111 mmol/L   CO2 24 22 - 32 mmol/L   Glucose, Bld 92 70 - 99 mg/dL    Comment: Glucose reference range applies only to samples taken after fasting for at least 8 hours.   BUN 9 6 - 20 mg/dL   Creatinine, Ser 0.74 0.44 - 1.00 mg/dL   Calcium 8.7 (L) 8.9 - 10.3 mg/dL   Total Protein 7.1 6.5 - 8.1 g/dL   Albumin 4.1 3.5 - 5.0 g/dL   AST 29 15 - 41 U/L   ALT 17 0 - 44 U/L   Alkaline Phosphatase 58 38 - 126 U/L   Total Bilirubin 0.8 0.3 - 1.2 mg/dL   GFR, Estimated >60 >60 mL/min    Comment: (NOTE) Calculated using the CKD-EPI Creatinine Equation (2021)    Anion gap 8 5 - 15    Comment: Performed at [facility] [address], [place], [name] [address]  Hemoglobin A1c     Status: Abnormal   Collection Time: 02/02/21 11:34 AM  Result Value Ref Range   Hgb A1c MFr Bld 5.7 (H) 4.8 - 5.6  %    Comment: (NOTE)         Prediabetes: 5.7 - 6.4         Diabetes: >6.4         Glycemic control for adults with diabetes: <7.0    Mean Plasma Glucose 117 mg/dL    Comment: (NOTE) Performed At: [facility] [address] [place], [place] [id number] Rush Farmer MD [id number]   TSH  Status: None   Collection Time: 02/02/21 11:34 AM  Result Value Ref Range   TSH 0.930 0.350 - 4.500 uIU/mL    Comment: Performed by a 3rd Generation assay with a functional sensitivity of <=0.01 uIU/mL. Performed at [facility], [place] [address], [place], [place] [address]   Prolactin     Status: Abnormal   Collection Time: 02/02/21 11:34 AM  Result Value Ref Range   Prolactin 36.3 (H) 4.8 - 23.3 ng/mL    Comment: (NOTE) Performed At: [facility] [place], [place] [id number] Rush Farmer MD [id number]   POCT Urine Drug Screen - (IC[facility])     Status: Abnormal   Collection Time: 02/02/21 11:34 AM  Result Value Ref Range   POC Amphetamine UR None Detected NONE DETECTED (Cut Off Level 1000 ng/mL)   POC Secobarbital (BAR) None Detected NONE DETECTED (Cut Off Level 300 ng/mL)   POC Buprenorphine (BUP) None Detected NONE DETECTED (Cut Off Level 10 ng/mL)   POC Oxazepam (BZO) Positive (A) NONE DETECTED (Cut Off Level 300 ng/mL)   POC Cocaine UR None Detected NONE DETECTED (Cut Off Level 300 ng/mL)   POC Methamphetamine UR None Detected NONE DETECTED (Cut Off Level 1000 ng/mL)   POC Morphine None Detected NONE DETECTED (Cut Off Level 300 ng/mL)   POC Oxycodone UR None Detected NONE DETECTED (Cut Off Level 100 ng/mL)   POC Methadone UR None Detected NONE DETECTED (Cut Off Level 300 ng/mL)   POC Marijuana UR Positive (A) NONE DETECTED (Cut Off Level 50 ng/mL)  Lipid panel     Status: Abnormal   Collection Time: 02/02/21 11:34 AM  Result Value Ref Range   Cholesterol 187 0 - 200 mg/dL   Triglycerides 46 <150 mg/dL   HDL 52 >40 mg/dL   Total CHOL/HDL Ratio  3.6 RATIO   VLDL 9 0 - 40 mg/dL   LDL Cholesterol 126 (H) 0 - 99 mg/dL    Comment:        Total Cholesterol/HDL:CHD Risk Coronary Heart Disease Risk Table                     Men   Women  1/2 Average Risk   3.4   3.3  Average Risk       5.0   4.4  2 X Average Risk   9.6   7.1  3 X Average Risk  23.4   11.0        Use the calculated Patient Ratio above and the CHD Risk Table to determine the patient's CHD Risk.        ATP III CLASSIFICATION (LDL):  <100     mg/dL   Optimal  100-129  mg/dL   Near or Above                    Optimal  130-159  mg/dL   Borderline  160-189  mg/dL   High  >190     mg/dL   Very High Performed at [facility] [address] [place], [place] [address]   Pregnancy, urine     Status: None   Collection Time: 02/02/21 11:42 AM  Result Value Ref Range   Preg Test, Ur NEGATIVE NEGATIVE    Comment:        THE SENSITIVITY OF THIS METHODOLOGY IS >20 mIU/mL. Performed at [facility], [place] [address], [place], [place] [address]     Current Facility-Administered  Medications  Medication Dose Route Frequency Provider Last Rate Last Admin   calcium carbonate (TUMS - dosed in mg elemental calcium) chewable tablet 200 mg of elemental calcium  1 tablet Oral Once [place], Greggory Brandy, MD       OLANZapine zydis (ZYPREXA) disintegrating tablet 5 mg  5 mg Oral BID Starkes-Perry, Alinah Sheard S, FNP       potassium chloride SA (KLOR-CON M) CR tablet 40 mEq  40 mEq Oral Once [place], Greggory Brandy, MD       Current Outpatient Medications  Medication Sig Dispense Refill   OLANZapine zydis (ZYPREXA) 10 MG disintegrating tablet Take 10 mg by mouth at bedtime.      Musculoskeletal: Strength & Muscle Tone: within normal limits Gait & Station: normal Patient leans: N/A   Psychiatric Specialty Exam:  Presentation  General Appearance: Casual  Eye Contact:Fair  Speech:Clear and Coherent; Normal Rate  Speech Volume:Normal  Handedness:Right   Mood and Affect   Mood:Anxious; Irritable  Affect:Congruent   Thought Process  Thought Processes:Coherent  Descriptions of Associations:Intact  Orientation:Full (Time, Place and Person)  Thought Content:Paranoid Ideation  History of Schizophrenia/Schizoaffective disorder:-- (patient reports she has been diagnosed with schizophrenia)  Duration of Psychotic Symptoms:Less than six months  Hallucinations:Hallucinations: None Ideas of Reference:None  Suicidal Thoughts:Suicidal Thoughts: No Homicidal Thoughts:Homicidal Thoughts: No  Sensorium  Memory:Immediate Fair; Recent Fair; Remote Fair  Judgment:Fair  Insight:Poor   Executive Functions  Concentration:Fair  Attention Span:Fair  [place]   Psychomotor Activity  Psychomotor Activity: Psychomotor Activity: Restlessness  Assets  Assets:Housing; Leisure Time; Physical Health; Resilience   Sleep  Sleep: Sleep: Fair  Physical Exam: Physical Exam Vitals and nursing note reviewed.  Pulmonary:     Effort: Pulmonary effort is normal.  Skin:    General: Skin is warm and dry.  Neurological:     Mental Status: She is alert and oriented to person, place, and time.  Psychiatric:        Attention and Perception: She is inattentive. She perceives visual hallucinations.        Mood and Affect: Mood is elated.        Speech: Speech normal.        Behavior: Behavior is agitated, aggressive, hyperactive and combative. Behavior is cooperative.        Thought Content: Thought content is delusional. Thought content does not include homicidal or suicidal ideation. Thought content does not include homicidal or suicidal plan.        Cognition and Memory: Cognition normal.        Judgment: Judgment is impulsive.   Review of Systems  Respiratory:  Negative for shortness of breath.   Cardiovascular:  Negative for chest pain.  Gastrointestinal:  Negative for abdominal pain, nausea and vomiting.   Neurological:  Negative for headaches.  Psychiatric/Behavioral: Negative.    Blood pressure 120/89, pulse 67, temperature 98.2 F (36.8 C), resp. rate 16, last menstrual period 01/24/2021, SpO2 100 %. There is no height or weight on file to calculate BMI.  Treatment Plan Summary: Plan Recommend inpatient at this time. Patient currently refusing medications at this time. Due to her level of aggression, comabtiveness and psychosis, may need to consider placing her under Force Med orders    -Recommend restarting psychiatric medications: Zyprexa Zydis 5 mg PO BID.  -Continue IVC at this time. -May require force med orders.   Disposition: Recommend psychiatric Inpatient admission when medically cleared.    Suella Broad, FNP 02/04/2021  10:32 AM

## 2021-02-04 NOTE — ED Notes (Signed)
Patient asked to use phone. Called dad and stated "come get me". Dad advised that patient was not discharged.

## 2021-02-04 NOTE — ED Notes (Signed)
Patient requesting a treadmill, upset that one is not available. States that parents did not approve transfer to "a facility". Advised that she could exercise in room.

## 2021-02-04 NOTE — BH Assessment (Addendum)
[facility] Assessment Progress Note   Per Vernard Gambles, NP, this pt requires psychiatric hospitalization.  Pt presents under IVC initiated by pt's father, and upheld by Hillery Jacks, NP.  Due to pt's physical aggression during this encounter, pt has been referred to [facility].  At 11:21 this writer called [facility] and spoke to [place], who accepted demographic information by telephone.  Referral information was then faxed to [facility].  At 11:47 Vaughan Basta confirms receipt of referral.  Please note that this does not mean that pt has been accepted to the facility.  As of this writing a final decision is pending.   Doylene Canning, MA Triage Specialist [phone]   Addendum:  At 16:01 this writer called [facility] and spoke to [place].  She reports that pt is now on their wait list as a non-priority referral.  Doylene Canning, MA Behavioral Health Coordinator [phone]

## 2021-02-04 NOTE — ED Notes (Signed)
[facility]hift RN stated that patient refused her potassium that was ordered d/t potassium being 3.0. I explained the patient the importance of taking the potassium but she still refused taking the medication.

## 2021-02-04 NOTE — ED Notes (Signed)
PRN order for Geodon obtained earlier from Dr Posey Rea.

## 2021-02-04 NOTE — ED Notes (Signed)
Patient requesting to shower. Materials given to patient to shower.

## 2021-02-04 NOTE — ED Notes (Signed)
Patient currently resting in bed.

## 2021-02-05 MED ORDER — LORAZEPAM 1 MG PO TABS
1.0000 mg | ORAL_TABLET | ORAL | Status: DC | PRN
  Filled 2021-02-05: qty 1

## 2021-02-05 MED ORDER — LORAZEPAM 2 MG/ML IJ SOLN: 2.0000 mg | Freq: Four times a day (QID) | INTRAMUSCULAR | Status: DC | PRN

## 2021-02-05 MED ORDER — LORAZEPAM 2 MG/ML IJ SOLN
2.0000 mg | Freq: Four times a day (QID) | INTRAMUSCULAR | Status: DC | PRN
  Administered 2021-02-05: 10:00:00 2 mg via INTRAMUSCULAR
  Filled 2021-02-05: qty 1

## 2021-02-05 MED ORDER — ZIPRASIDONE MESYLATE 20 MG IM SOLR
20.0000 mg | INTRAMUSCULAR | Status: AC | PRN
  Administered 2021-02-05: 10:00:00 20 mg via INTRAMUSCULAR

## 2021-02-05 MED ORDER — ZIPRASIDONE MESYLATE 20 MG IM SOLR
20.0000 mg | Freq: Once | INTRAMUSCULAR | Status: DC
Start: 2021-02-05 — End: 2021-02-07

## 2021-02-05 MED ORDER — OLANZAPINE 10 MG PO TBDP
10.0000 mg | ORAL_TABLET | Freq: Three times a day (TID) | ORAL | Status: DC | PRN
  Filled 2021-02-05: qty 1

## 2021-02-05 NOTE — ED Notes (Signed)
Patient resting comfortably

## 2021-02-05 NOTE — ED Notes (Signed)
Patient agitated and upset. Argumentative. Continuously coming out room .  Difficult to redirect.  Limited insight. Labile.  Slamming doors.  PRN given ( see MAR).

## 2021-02-05 NOTE — ED Provider Notes (Signed)
Emergency Medicine Observation Re-evaluation Note  Shavonda Wiedman is a 21 y.o. female, seen on rounds today.  Pt initially presented to the ED for complaints of Psychiatric Evaluation Currently, the patient is talking with ED staff.  Physical Exam  BP 103/61 (BP Location: Right Arm)    Pulse 64    Temp 98.3 F (36.8 C) (Oral)    Resp 16    LMP 01/24/2021    SpO2 100%  Physical Exam General: Awake, alert Cardiac: Extremities well perfused Lungs: Unlabored breathing Psych: Mild agitation/frustration with being in the ED, not actively responding to internal stimuli  ED Course / MDM  EKG:EKG Interpretation  Date/Time:  Tuesday February 04 2021 04:17:57 EST Ventricular Rate:  68 PR Interval:  148 QRS Duration: 72 QT Interval:  380 QTC Calculation: 404 R Axis:   57 Text Interpretation: Normal sinus rhythm Normal ECG When compared with ECG of 02-Feb-2021 15:41, No significant change since last tracing Confirmed by Zadie Rhine ([id number]) on 02/04/2021 5:03:20 AM  I have reviewed the labs performed to date as well as medications administered while in observation.  Recent changes in the last 24 hours include none.  Plan  Current plan is for inpatient psychiatric placement. Annell Canty is under involuntary commitment.      Gloris Manchester, MD 02/05/21 1019

## 2021-02-05 NOTE — ED Notes (Signed)
Patient alert.  Patient showered.  Argumentative with staff, redirected. Patient in room.

## 2021-02-05 NOTE — BH Assessment (Signed)
[facility] Assessment Progress Note   At 08:16 this writer called [facility] and spoke to [place].  She reports that pt is on their wait list.  Pt is not a priority referral.  Doylene Canning, MA Behavioral Health Coordinator [phone]

## 2021-02-05 NOTE — ED Notes (Signed)
Kiara [name] admits to placing the lid from a drink cup in toilet purposely causing it to over flow

## 2021-02-05 NOTE — BH Assessment (Addendum)
[facility] Assessment Progress Note   Per Caryn Bee, NP , this pt requires psychiatric hospitalization at this time.  Pt presents under IVC initiated by pt's father and upheld by Hillery Jacks, NP.  [facility] will not be able to accommodate this patient at this time.  At the direction of Nelly Rout, MD this writer has sought placement for this patient at facilities outside of the [facility] system.  In addition to referral to [facility], the following facilities have been contacted to seek placement for this pt, with results as noted:  Beds available, information sent, decision pending: [facility] [place]  At capacity: [facility]   Doylene Canning, [place] Behavioral Health Coordinator [phone]

## 2021-02-06 NOTE — BH Assessment (Signed)
BHH Assessment Progress Note   At 15:39 this Clinical research associate spoke to [place] at [facility].  Pt has been accepted to their facility by Dr Estill Cotta.  They will be ready to receive pt tomorrow, Friday, 02/07/2021 after 07:00.  Pt is under IVC and is to be transported via [facility].  Please call nursing report to [phone] or [phone] when the time comes.  EDP Vanetta Mulders, MD and pt's nurse, Waynetta Sandy, have been notified.  Doylene Canning, [place] Behavioral Health Coordinator [phone]

## 2021-02-06 NOTE — ED Notes (Signed)
Patient alert this shift. Patient has been calm and cooperative with redirection this shift. Patient ate all meals, resting at times.  No suicidal or homicidal ideation noted.

## 2021-02-06 NOTE — Progress Notes (Signed)
[facility]/BMU LCSW Progress Note   02/06/2021    10:19 AM  Kiara [name]   [phone]   Type of Contact and Topic:  [facility] Waitlist   CSW contacted [facility] to get update on patient admission states. Patient remains on the [facility] waitlist, she is not considered to be priority. Situation ongoing, CSW team will continue to monitor and update note as more information becomes available.     Signed:  Corky Crafts, MSW, LCSWA, LCAS 02/06/2021 10:19 AM

## 2021-02-06 NOTE — ED Notes (Signed)
Sleeping no distress noted.

## 2021-02-06 NOTE — ED Provider Notes (Signed)
Emergency Medicine Observation Re-evaluation Note  Kiara [name] is a 21 y.o. female, seen on rounds today.  Pt initially presented to the ED for complaints of Psychiatric Evaluation Currently, the patient is resting.  Physical Exam  BP 104/73 (BP Location: Left Arm)    Pulse 81    Temp 98.1 F (36.7 C) (Oral)    Resp 18    LMP 01/24/2021    SpO2 100%  Physical Exam General: Sleeping Cardiac: Regular rate Lungs: Breathing easily Psych: Normal mood  ED Course / MDM  EKG:EKG Interpretation  Date/Time:  Tuesday February 04 2021 04:17:57 EST Ventricular Rate:  68 PR Interval:  148 QRS Duration: 72 QT Interval:  380 QTC Calculation: 404 R Axis:   57 Text Interpretation: Normal sinus rhythm Normal ECG When compared with ECG of 02-Feb-2021 15:41, No significant change since last tracing Confirmed by Zadie Rhine ([id number]) on 02/04/2021 5:03:20 AM  I have reviewed the labs performed to date as well as medications administered while in observation.  Recent changes in the last 24 hours include patient had an episode of putting debris in the toilet in order to clog it up.  Has been argumentative intermittently..  Plan  Current plan is for inpatient treatment. Kiara [name] is under involuntary commitment.      Linwood Dibbles, MD 02/06/21 [phone]

## 2021-02-07 NOTE — ED Notes (Signed)
Returned pt yellow shirt, black sweat pants, white underwear, and black shoes. Pt verifies all belonging are present while NT YT present. Gave pt shoes to wear and yellow shirt to change into. Pt sweat pants have a string in the waistband, so these were not returned to pt.

## 2021-02-07 NOTE — ED Provider Notes (Signed)
Emergency Medicine Observation Re-evaluation Note  Kiara [name] is a 21 y.o. female, seen on rounds today.  Pt initially presented to the ED for complaints of Psychiatric Evaluation Currently, the patient is awaiting placement.  Physical Exam  BP (!) 94/54 (BP Location: Left Arm)    Pulse 71    Temp 97.6 F (36.4 C) (Oral)    Resp 16    LMP 01/24/2021    SpO2 99%  Physical Exam General: Resting Cardiac: No murmur Lungs: Clear Psych: Resting  ED Course / MDM  EKG:EKG Interpretation  Date/Time:  Tuesday February 04 2021 04:17:57 EST Ventricular Rate:  68 PR Interval:  148 QRS Duration: 72 QT Interval:  380 QTC Calculation: 404 R Axis:   57 Text Interpretation: Normal sinus rhythm Normal ECG When compared with ECG of 02-Feb-2021 15:41, No significant change since last tracing Confirmed by Zadie Rhine ([id number]) on 02/04/2021 5:03:20 AM  I have reviewed the labs performed to date as well as medications administered while in observation.  Recent changes in the last 24 hours include none.  Plan  Current plan is for placement.  Per nursing she has been accepted at other facility.  Will do EMTALA so she will be transferred.  Per documentation she been accepted to [facility] under the care of Dr. Estill Cotta. Roniya Tetro is under involuntary commitment.      Javel Hersh, Canary Brim, MD 02/07/21 1027

## 2021-02-18 ENCOUNTER — Telehealth (HOSPITAL_COMMUNITY): Payer: Self-pay

## 2021-02-18 NOTE — BH Assessment (Signed)
Care Management - Follow Up [facility] Discharges   Patient has been placed in an inpatient psychiatric hospital [facility]) on 02-07-2021.

## 2021-02-23 ENCOUNTER — Other Ambulatory Visit: Payer: Self-pay

## 2021-02-23 ENCOUNTER — Encounter (HOSPITAL_COMMUNITY): Payer: Self-pay | Admitting: Emergency Medicine

## 2021-02-23 ENCOUNTER — Emergency Department (HOSPITAL_COMMUNITY)
Admission: EM | Admit: 2021-02-23 | Discharge: 2021-02-23 | Disposition: A | Discharge status: Home / Self Care | Payer: Medicaid Other | Attending: Emergency Medicine | Admitting: Emergency Medicine

## 2021-02-23 DIAGNOSIS — R1084 Generalized abdominal pain: Secondary | ICD-10-CM | POA: Diagnosis not present

## 2021-02-23 LAB — CBC WITH DIFFERENTIAL/PLATELET
Abs Immature Granulocytes: 0.02 10*3/uL (ref 0.00–0.07)
Basophils Absolute: 0 10*3/uL (ref 0.0–0.1)
Basophils Relative: 0 %
Eosinophils Absolute: 0.2 10*3/uL (ref 0.0–0.5)
Eosinophils Relative: 3 %
HCT: 46.6 % — ABNORMAL HIGH (ref 36.0–46.0)
Hemoglobin: 15.6 g/dL — ABNORMAL HIGH (ref 12.0–15.0)
Immature Granulocytes: 0 %
Lymphocytes Relative: 34 %
Lymphs Abs: 2 10*3/uL (ref 0.7–4.0)
MCH: 29.7 pg (ref 26.0–34.0)
MCHC: 33.5 g/dL (ref 30.0–36.0)
MCV: 88.6 fL (ref 80.0–100.0)
Monocytes Absolute: 0.8 10*3/uL (ref 0.1–1.0)
Monocytes Relative: 13 %
Neutro Abs: 2.9 10*3/uL (ref 1.7–7.7)
Neutrophils Relative %: 50 %
Platelets: 305 10*3/uL (ref 150–400)
RBC: 5.26 MIL/uL — ABNORMAL HIGH (ref 3.87–5.11)
RDW: 14.1 % (ref 11.5–15.5)
WBC: 5.9 10*3/uL (ref 4.0–10.5)
nRBC: 0 % (ref 0.0–0.2)

## 2021-02-23 LAB — RAPID URINE DRUG SCREEN, HOSP PERFORMED
Amphetamines: NOT DETECTED
Barbiturates: NOT DETECTED
Benzodiazepines: NOT DETECTED
Cocaine: NOT DETECTED
Opiates: NOT DETECTED
Tetrahydrocannabinol: POSITIVE — AB

## 2021-02-23 LAB — COMPREHENSIVE METABOLIC PANEL
ALT: 16 U/L (ref 0–44)
AST: 21 U/L (ref 15–41)
Albumin: 4.8 g/dL (ref 3.5–5.0)
Alkaline Phosphatase: 66 U/L (ref 38–126)
Anion gap: 10 (ref 5–15)
BUN: 10 mg/dL (ref 6–20)
CO2: 23 mmol/L (ref 22–32)
Calcium: 9.6 mg/dL (ref 8.9–10.3)
Chloride: 105 mmol/L (ref 98–111)
Creatinine, Ser: 0.66 mg/dL (ref 0.44–1.00)
GFR, Estimated: >60 mL/min (ref >60)
Glucose, Bld: 80 mg/dL (ref 70–99)
Potassium: 4.2 mmol/L (ref 3.5–5.1)
Sodium: 138 mmol/L (ref 135–145)
Total Bilirubin: 0.7 mg/dL (ref 0.3–1.2)
Total Protein: 8.1 g/dL (ref 6.5–8.1)

## 2021-02-23 LAB — I-STAT BETA HCG BLOOD, ED (MC, WL, AP ONLY): I-stat hCG, quantitative: <5 m[IU]/mL (ref <5)

## 2021-02-23 LAB — ETHANOL: Alcohol, Ethyl (B): <10 mg/dL (ref <10)

## 2021-02-23 MED ORDER — MEBENDAZOLE 100 MG PO CHEW: CHEWABLE_TABLET | ORAL | 0 refills | Status: DC

## 2021-02-23 NOTE — ED Provider Notes (Signed)
[facility] DEPT Provider Note   CSN: [id number] Arrival date & time: 02/23/21  1409     History  Chief Complaint  Patient presents with   Leg Pain    Kiara [name] is a 21 y.o. female.  Patient presents to the emergency department today.  She has a history of psychosis.  She was recently in the emergency department and transferred to [facility].  Patient states that she has worms.  When asked for her to give details, she does not respond.  She looks at me, chews her gum, smiles at times, but does not respond any further questioning.      Home Medications Prior to Admission medications   Medication Sig Start Date End Date Taking? Authorizing Provider  OLANZapine zydis (ZYPREXA) 10 MG disintegrating tablet Take 10 mg by mouth at bedtime. Patient not taking: Reported on 02/04/2021 11/24/20   [provider]      Allergies    Patient has no known allergies.    Review of Systems   Review of Systems  Physical Exam Updated Vital Signs BP 120/77 (BP Location: Right Arm)    Pulse 76    Temp 98 F (36.7 C) (Oral)    Resp 14    LMP 01/24/2021    SpO2 99%  Physical Exam Vitals and nursing note reviewed.  Constitutional:      Appearance: She is well-developed.  HENT:     Head: Normocephalic and atraumatic.  Eyes:     Conjunctiva/sclera: Conjunctivae normal.  Pulmonary:     Effort: No respiratory distress.  Abdominal:     Tenderness: There is no abdominal tenderness.  Musculoskeletal:     Cervical back: Normal range of motion and neck supple.  Skin:    General: Skin is warm and dry.  Neurological:     Mental Status: She is alert.  Psychiatric:        Attention and Perception: She is inattentive.        Mood and Affect: Affect is blunt.     Comments: Patient is chewing gum, mostly nonparticipatory with questioning.  She does not appear to be in any distress.    ED Results / Procedures / Treatments   Labs (all labs ordered are  listed, but only abnormal results are displayed) Labs Reviewed - No data to display  EKG None  Radiology No results found.  Procedures Procedures    Medications Ordered in ED Medications - No data to display  ED Course/ Medical Decision Making/ A&P    Patient seen.  She is sitting in a hallway bed, chewing gum, using her phone.  She does not appear to be hallucinating.  She will only tell me that she "has worms".  She will not respond any follow-up questioning.  Sometimes she looks away and continues to use her phone.    I attempted to call patient's legal guardian noted in epic.  Left voicemail.  Most recent vital signs reviewed and are as follows: BP 120/77 (BP Location: Right Arm)    Pulse 76    Temp 98 F (36.7 C) (Oral)    Resp 14    LMP 01/24/2021    SpO2 99%   3:38 PM Reassessment performed.  Patient looks stable.  Continues to be unwilling to answer questions.  No callback from guardian.  Will order medical screening labs.  4:42 PM I was able to speak with the patient's father, Lu Duffel, who presented to the emergency department.  He  reports that patient was at [facility] for about a week.  She was improved when she returned home.  She is currently living with him.  However, since that time she has started to have hallucinations again and has been talking to herself.  He is not certain, but is concerned that she has been doing drugs received from a friend.  He thinks that she is "having problems" again.  I asked him if he felt that she requires another psychiatric evaluation given her past history and he does.  5:22 PM labs reviewed.  Patient is medically cleared.  8:58 PM patient refused to talk to TTS.  Father was called who has come to pick the patient up.  They would like treatment for worms.  Discussed reassuring lab results.  Will prescribe mebendazole.   Patient is talkative with father at bedside.  He is comfortable taking her home.  No indications for IVC at  this time.  Encouraged outpatient follow-up for concerns addressed above.                           Medical Decision Making Amount and/or Complexity of Data Reviewed Labs: ordered.  Risk Prescription drug management.   Patient presented with complaints of worms in the abdomen but unwilling to give details about her complaint.  Discussed with patient's father as discussed above.  Initial plan was to have her speak with TTS.  She eventually refused.  Plan for discharge to home.  Labs are reassuring.  Given that she has possibly seen worms in her stool, will prescribe mebendazole to take for potential pinworm infection.  Encouraged PCP follow-up.  No emergency suspected.  Patient does not seem to be a danger to herself or others at this time.          Final Clinical Impression(s) / ED Diagnoses Final diagnoses:  Generalized abdominal pain    Rx / DC Orders ED Discharge Orders     None         Carlisle Cater, PA-C [id number]    [place] Stall P, DO [id number]

## 2021-02-23 NOTE — ED Notes (Signed)
Pt. Belongings given to father to take home.

## 2021-02-23 NOTE — BH Assessment (Signed)
Clinician messaged Lonni Fix, RN: "Hey. It's Trey with TTS. Is the pt able to engage in the assessment, if so the pt will need to be placed in a private room. Also is the pt under IVC?"    Clinician awaiting response.    Redmond Pulling, MS, [facility], [facility] Triage Specialist [phone]

## 2021-02-23 NOTE — ED Notes (Signed)
Belongings are in cabinets 1-4, 2 bags labeled.

## 2021-02-23 NOTE — Discharge Instructions (Addendum)
Your medical labs look reassuring today.  Do not suspect any medical emergencies.  As you did not want to talk to our behavioral health counselors tonight, please call your doctor tomorrow to discuss follow-up.  I have prescribed a medication to take for pinworms.  Please take as directed.

## 2021-02-23 NOTE — BH Assessment (Signed)
Per Lonni Fix, RN, the pt is able to engage and she will inform clinician when she locats the TTS cart; pt expressed wanting to leave and called her dad but she wants the pt to be assessed first.   Pt's RN reports, the pt refused TTS, she tried but the pt is very anxious. Clinician asked RN to let her know if the pt want's to engage in the assessment at a later time.    Redmond Pulling, MS, [facility], [facility] Triage Specialist [phone]

## 2021-02-23 NOTE — ED Triage Notes (Signed)
Patient c/o pain to L leg and states she has worms in her abdomen. Patient slow to respond to questions in triage. Hx psychosis.

## 2021-03-04 ENCOUNTER — Emergency Department (HOSPITAL_COMMUNITY)
Admission: EM | Admit: 2021-03-04 | Discharge: 2021-03-04 | Disposition: A | Discharge status: Home / Self Care | Payer: Medicaid Other | Attending: Emergency Medicine | Admitting: Emergency Medicine

## 2021-03-04 ENCOUNTER — Other Ambulatory Visit: Payer: Self-pay

## 2021-03-04 ENCOUNTER — Encounter (HOSPITAL_COMMUNITY): Payer: Self-pay

## 2021-03-04 DIAGNOSIS — Z5321 Procedure and treatment not carried out due to patient leaving prior to being seen by health care provider: Secondary | ICD-10-CM | POA: Diagnosis not present

## 2021-03-04 DIAGNOSIS — F99 Mental disorder, not otherwise specified: Secondary | ICD-10-CM | POA: Diagnosis not present

## 2021-03-04 NOTE — ED Notes (Signed)
Pt is refusing blood work and being seen at all. Pt states she wants her results from last time she was here, nurse told her that she can access it on MyChart

## 2021-03-04 NOTE — ED Provider Triage Note (Signed)
Emergency Medicine Provider Triage Evaluation Note  Kiara [name] , a 21 y.o. female  was evaluated in triage.  Pt complains of here for evaluation of "psych issue" per triage. Patient seen multiple times for similar. She refuses to talk to me. Just stares at me when asking questions, will not answer questions. Recently dc from [place].  Review of Systems  Positive: Psych issue Negative:   Physical Exam  BP 118/76 (BP Location: Left Arm)    Pulse 75    Temp 98.8 F (37.1 C) (Oral)    Resp 17    Ht 5\' 3"  (1.6 m)    Wt 61.2 kg    SpO2 100%    BMI 23.91 kg/m  Gen:   Awake, no distress   Resp:  Normal effort  MSK:   Moves extremities without difficulty  PsychL: Will not answer questions, just stares  Other:    Medical Decision Making  Medically screening exam initiated at 11:20 PM.  Appropriate orders placed.  Kiara [name] was informed that the remainder of the evaluation will be completed by another provider, this initial triage assessment does not replace that evaluation, and the importance of remaining in the ED until their evaluation is complete.  Psych issue   Kiara [name] A, PA-C 03/04/21 2322

## 2021-03-04 NOTE — ED Notes (Signed)
Pt is leaving and just wants to check her mychart.

## 2021-03-04 NOTE — ED Triage Notes (Signed)
Pt refuses to answer questions. She just says she wants her results.

## 2021-03-20 ENCOUNTER — Encounter (HOSPITAL_COMMUNITY): Payer: Self-pay

## 2021-03-20 ENCOUNTER — Emergency Department (HOSPITAL_COMMUNITY)
Admission: EM | Admit: 2021-03-20 | Discharge: 2021-03-22 | Disposition: A | Discharge status: Home / Self Care | Payer: Medicaid Other | Attending: Emergency Medicine | Admitting: Emergency Medicine

## 2021-03-20 DIAGNOSIS — F29 Unspecified psychosis not due to a substance or known physiological condition: Secondary | ICD-10-CM | POA: Insufficient documentation

## 2021-03-20 DIAGNOSIS — Z20822 Contact with and (suspected) exposure to covid-19: Secondary | ICD-10-CM | POA: Insufficient documentation

## 2021-03-20 DIAGNOSIS — Y900 Blood alcohol level of less than 20 mg/100 ml: Secondary | ICD-10-CM | POA: Insufficient documentation

## 2021-03-20 DIAGNOSIS — Z008 Encounter for other general examination: Secondary | ICD-10-CM

## 2021-03-20 LAB — CBC
HCT: 46.5 % — ABNORMAL HIGH (ref 36.0–46.0)
Hemoglobin: 15.1 g/dL — ABNORMAL HIGH (ref 12.0–15.0)
MCH: 29.1 pg (ref 26.0–34.0)
MCHC: 32.5 g/dL (ref 30.0–36.0)
MCV: 89.6 fL (ref 80.0–100.0)
Platelets: 228 10*3/uL (ref 150–400)
RBC: 5.19 MIL/uL — ABNORMAL HIGH (ref 3.87–5.11)
RDW: 14.9 % (ref 11.5–15.5)
WBC: 5.6 10*3/uL (ref 4.0–10.5)
nRBC: 0 % (ref 0.0–0.2)

## 2021-03-20 LAB — ETHANOL: Alcohol, Ethyl (B): <10 mg/dL (ref <10)

## 2021-03-20 LAB — URINALYSIS, ROUTINE W REFLEX MICROSCOPIC
Bacteria, UA: NONE SEEN
Bilirubin Urine: NEGATIVE
Glucose, UA: NEGATIVE mg/dL
Ketones, ur: 80 mg/dL — AB
Nitrite: NEGATIVE
Protein, ur: 100 mg/dL — AB
Specific Gravity, Urine: 1.034 — ABNORMAL HIGH (ref 1.005–1.030)
pH: 5 (ref 5.0–8.0)

## 2021-03-20 LAB — BASIC METABOLIC PANEL
Anion gap: 12 (ref 5–15)
BUN: 16 mg/dL (ref 6–20)
CO2: 22 mmol/L (ref 22–32)
Calcium: 9.2 mg/dL (ref 8.9–10.3)
Chloride: 104 mmol/L (ref 98–111)
Creatinine, Ser: 0.67 mg/dL (ref 0.44–1.00)
GFR, Estimated: >60 mL/min (ref >60)
Glucose, Bld: 51 mg/dL — ABNORMAL LOW (ref 70–99)
Potassium: 3.9 mmol/L (ref 3.5–5.1)
Sodium: 138 mmol/L (ref 135–145)

## 2021-03-20 LAB — RAPID URINE DRUG SCREEN, HOSP PERFORMED
Amphetamines: NOT DETECTED
Barbiturates: NOT DETECTED
Benzodiazepines: NOT DETECTED
Cocaine: NOT DETECTED
Opiates: NOT DETECTED
Tetrahydrocannabinol: POSITIVE — AB

## 2021-03-20 LAB — RESP PANEL BY RT-PCR (FLU A&B, COVID) ARPGX2
Influenza A by PCR: NEGATIVE
Influenza B by PCR: NEGATIVE
SARS Coronavirus 2 by RT PCR: NEGATIVE

## 2021-03-20 LAB — ACETAMINOPHEN LEVEL: Acetaminophen (Tylenol), Serum: <10 ug/mL — ABNORMAL LOW (ref 10–30)

## 2021-03-20 MED ORDER — OLANZAPINE 10 MG PO TBDP
10.0000 mg | ORAL_TABLET | Freq: Every day | ORAL | Status: DC
Start: 2021-03-20 — End: 2021-03-22
  Administered 2021-03-20: 16:00:00 10 mg via ORAL
  Filled 2021-03-20 (×2): qty 1

## 2021-03-20 NOTE — ED Notes (Signed)
Pt states she has not eaten or drank in 3 days for her fast. Lips are noticeably dry. No complaints at this time. ?

## 2021-03-20 NOTE — ED Provider Notes (Signed)
Request for nurse to give medication for agitation.  Apparently having periods of disorganized behavior.  She is currently under involuntary commitment.  She has been accepted for transfer to a psychiatric facility.  Plan for transfer, tomorrow morning.  I have completed first examination for involuntary commitment form. ?  ?Mancel Bale, MD ?03/20/21 1950 ? ?

## 2021-03-20 NOTE — BH Assessment (Signed)
[facility] Assessment Progress Note ?  ?Per Caryn Bee, NP, this pt requires psychiatric hospitalization at this time.  Pt presents under IVC initiated by pt's father;  First Exam is pending as of this writing.  The following facilities have been contacted to seek placement for this pt, with results as noted: ? ?Beds available, information sent, decision pending: ?[facility] ?[facility] ?[facility] ?[facility] ? ?At capacity: ?[facility] ? ? ?Doylene Canning, MA ?Behavioral Health Coordinator ?[phone] ? ?

## 2021-03-20 NOTE — ED Notes (Signed)
This Clinical research associate informed pt bloodwork was needed. Pt refused, stating she had bloodwork done 2 weeks ago and we could get that. Asked for previous results, informed she could access in mychart. Pt says 'last time I was here you gave me all these meds, you gave me geodon, I don't want any meds. Send me somewhere else. They don't need bloodwork there." Informed pt any other facility would require bloodwork and drug screen. Pt again refused.  ?

## 2021-03-20 NOTE — ED Notes (Addendum)
Market researcher of PT vitals by [employer]. Writer will recheck vitals at this time ?

## 2021-03-20 NOTE — Progress Notes (Signed)
Pt was accepted to [facility] PENDING Negative COVID-19 test results on 03/21/21 after 9:00am; [place]  ? ?Pt meets inpatient criteria per Sheran Fava, NP ? ?Attending Physician will be Dr. Jonelle Sports ? ?Report can be called to: [phone] or [phone] ? ?Pt can arrive after 9:00am ? ?Care Team notified: Wilmon Arms, RN, and Daleen Bo, MD ? ?Nadara Mode, LCSWA ?03/20/2021 @ 5:55 PM ? ?
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
## 2021-03-20 NOTE — ED Notes (Signed)
Pt is no longer cooperative.  She said she is not going to give [place] a urine sample and will not let us get her blood.  She put the scrub pants on but would not take her shirt off. ?

## 2021-03-20 NOTE — BH Assessment (Addendum)
@  1303, Clinician requested patient's nurse (I-Li, RN) to set up the TTS machine for patient's initial TTS assessment.  ? ?@1314 , Informed by NT that patient has been moved to . Requested NT to inform this Clinician when patient is ready to start the TTS assessment.  ?

## 2021-03-20 NOTE — ED Notes (Signed)
Pt has 1 bag of belonging with sweat pants and Clancy Mullarkey sneakers.  Pt refused to take her shirt off. ?

## 2021-03-20 NOTE — ED Notes (Signed)
Pt has refused everything including vitals ?

## 2021-03-20 NOTE — ED Provider Notes (Signed)
?[facility]-EMERGENCY DEPT ?Provider Note ? ? ?CSN: [id number] ?Arrival date & time: 03/20/21  1043 ? ?  ? ?History ? ?Chief Complaint  ?Patient presents with  ? IVC  ? ? ?Kiara [name] is a 21 y.o. female. ? ?21 year old female presents today for evaluation of psychosis.  She has been made IVC by her dad.  Patient presents with [facility].  Officer reports that dad took out IVC papers on patient because she is yesterday she was running naked in the front yard, backyard, around the house.  She has been noncompliant with her medicines.  When patient is asked about going outside naked states she has been but is unable to give a reasoning.  She also says she hears voices but they do not tell her to hurt herself or anyone else.  She denies suicidal ideations.  She denies visual hallucinations.  She does state she does not want any blood work done today.  States we have had plenty of blood work drawn recently.  She does states she has not been taking her medication for months. ? ?The history is provided by the patient and the police. No language interpreter was used.  ? ?  ? ?Home Medications ?Prior to Admission medications   ?Medication Sig Start Date End Date Taking? Authorizing Provider  ?mebendazole (VERMOX) 100 MG chewable tablet Take one tablet PO once and repeat once in 2 weeks 02/23/21   Renne Crigler, PA-C  ?OLANZapine zydis (ZYPREXA) 10 MG disintegrating tablet Take 10 mg by mouth at bedtime. ?Patient not taking: Reported on 02/04/2021 11/24/20   [provider]  ?   ? ?Allergies    ?Patient has no known allergies.   ? ?Review of Systems   ?Review of Systems  ?Unable to perform ROS: Psychiatric disorder  ?Cardiovascular:  Negative for chest pain.  ?Gastrointestinal:  Negative for abdominal pain.  ?All other systems reviewed and are negative. ? ?Physical Exam ?Updated Vital Signs ?BP 124/83 (BP Location: Right Arm)   Pulse 60   Temp 99 ?F (37.2 ?C) (Oral)   Resp 18    Ht 5\' 3"  (1.6 m)   Wt 61.2 kg   SpO2 99%   BMI 23.90 kg/m?  ?Physical Exam ?Vitals and nursing note reviewed.  ?Constitutional:   ?   General: She is not in acute distress. ?   Appearance: Normal appearance. She is not ill-appearing.  ?HENT:  ?   Head: Normocephalic and atraumatic.  ?   Nose: Nose normal.  ?Eyes:  ?   Conjunctiva/sclera: Conjunctivae normal.  ?Pulmonary:  ?   Effort: Pulmonary effort is normal. No respiratory distress.  ?Musculoskeletal:     ?   General: No deformity.  ?Skin: ?   Findings: No rash.  ?Neurological:  ?   Mental Status: She is alert.  ? ? ?ED Results / Procedures / Treatments   ?Labs ?(all labs ordered are listed, but only abnormal results are displayed) ?Labs Reviewed - No data to display ? ?EKG ?None ? ?Radiology ?No results found. ? ?Procedures ?Procedures  ? ? ?Medications Ordered in ED ?Medications - No data to display ? ?ED Course/ Medical Decision Making/ A&P ?  ?                        ?Medical Decision Making ?Amount and/or Complexity of Data Reviewed ?Labs: ordered. ? ? ?21 year old female presents today for psych evaluation.  Her dad took out IVC against her  because she has been noncompliant with her home medication and since yesterday has been walking around naked outside of the house.  She reports noncompliance with her medication.  She also endorses auditory hallucinations.  She denies SI, HI.  Denies other complaints at this time.  Will obtain basic labs.  Will place TTS consult. ?CBC without leukocytosis or anemia.  BMP without acute concerns.  UDS positive for THC.  UA with trace leukocytes and nitrite negative.  Low concern for UTI.  No bacteria seen, minimal WBCs in urine. Patient is cleared for psych evaluation. ? ? ?Final Clinical Impression(s) / ED Diagnoses ?Final diagnoses:  ?Psychosis, unspecified psychosis type (HCC)  ? ? ?Rx / DC Orders ?ED Discharge Orders   ? ? None  ? ?  ? ? ?  ?Marita [place], PA-C ?03/20/21 1534 ? ?  ?Jacalyn Lefevre, MD ?03/22/21  0700 ? ?

## 2021-03-20 NOTE — ED Notes (Signed)
Patient alert.  Patient disorganized. Affect incongruent, laughing inappropriately. Grandiose ideas.     ?

## 2021-03-20 NOTE — ED Triage Notes (Signed)
Pt IVC by father, paperwork states pt has been walking around naked and refusing to take medication, having hallucinations and talking to self. Pt states she has not taken her meds since the last time she was in a [facility] facility, states they make her 'slow and slurred'. States "I'm doing a fast to get my knowledge back." Endorses hallucinations. Denies SI/HI, denies pain. Cooperative at this time. ?

## 2021-03-20 NOTE — ED Notes (Signed)
RN is aware that we need to wait for a room to become available so we can get her EKG ?

## 2021-03-21 LAB — COMPREHENSIVE METABOLIC PANEL
ALT: 11 U/L (ref 0–44)
AST: 19 U/L (ref 15–41)
Albumin: 4.6 g/dL (ref 3.5–5.0)
Alkaline Phosphatase: 55 U/L (ref 38–126)
Anion gap: 15 (ref 5–15)
BUN: 16 mg/dL (ref 6–20)
CO2: 20 mmol/L — ABNORMAL LOW (ref 22–32)
Calcium: 9.4 mg/dL (ref 8.9–10.3)
Chloride: 105 mmol/L (ref 98–111)
Creatinine, Ser: 0.75 mg/dL (ref 0.44–1.00)
GFR, Estimated: >60 mL/min (ref >60)
Glucose, Bld: 45 mg/dL — ABNORMAL LOW (ref 70–99)
Potassium: 4.3 mmol/L (ref 3.5–5.1)
Sodium: 140 mmol/L (ref 135–145)
Total Bilirubin: 1.2 mg/dL (ref 0.3–1.2)
Total Protein: 8.1 g/dL (ref 6.5–8.1)

## 2021-03-21 LAB — CBG MONITORING, ED
Glucose-Capillary: 61 mg/dL — ABNORMAL LOW (ref 70–99)
Glucose-Capillary: 127 mg/dL — ABNORMAL HIGH (ref 70–99)

## 2021-03-21 LAB — HCG, QUANTITATIVE, PREGNANCY: hCG, Beta Chain, Quant, S: 1 m[IU]/mL (ref <5)

## 2021-03-21 MED ORDER — ZIPRASIDONE MESYLATE 20 MG IM SOLR: 20.0000 mg | INTRAMUSCULAR | Status: DC | PRN

## 2021-03-21 MED ORDER — OLANZAPINE 5 MG PO TBDP: 5.0000 mg | ORAL_TABLET | Freq: Three times a day (TID) | ORAL | Status: DC | PRN

## 2021-03-21 MED ORDER — LORAZEPAM 1 MG PO TABS: 1.0000 mg | ORAL_TABLET | ORAL | Status: DC | PRN

## 2021-03-21 NOTE — ED Notes (Signed)
Transport called message left for [employer] ?

## 2021-03-21 NOTE — ED Notes (Signed)
Patient currently in room mixing all the food together. States that she is "making something". Encouraged to eat.  ?

## 2021-03-21 NOTE — BH Assessment (Addendum)
[facility] Assessment Progress Note ?  ?Per Caryn Bee, NP this involuntary pt continues to require psychiatric hospitalization.  Pt was accepted to [facility] by Dr Loyola Mast last night, however, this was conditioned upon pt eating, and as of this writing pt has not cooperated with this expectation.  This Clinical research associate has therefore resumed seeking placement for this pt with results as noted: ? ?Beds available, information sent, decision pending: ?[facility] ?[facility] ?[facility] ?[facility] ?[facility] ?[facility] ?[facility] ?[facility] Plater ?[facility] Libra ?[facility] Stabile ?[facility] Richters ?[place] ? ?Unable to reach: ?[place] (left message at 12:29) ?[facility] (left message at 14:34) ?[place] (left message at 14:51) ?[place] (left message at 14:53) ?[place] (left message at 14:55) ? ?At capacity: ?[place] ?[place] ?[facility] ?[place] ?[facility] ?[facility] ?[facility] ?[facility] ? ? ?Doylene Canning, MA ?Behavioral Health Coordinator ?[phone] ? ?

## 2021-03-21 NOTE — ED Notes (Signed)
Patient currently eating dinner tray  ?

## 2021-03-21 NOTE — ED Notes (Signed)
Spoke with pt and encouraged them to try and eat and drink. Pt states they are "fasting".  Pt educated that they will become extremely weak if they continue to abstain. ?

## 2021-03-21 NOTE — BH Assessment (Signed)
[facility] Assessment Progress Note ?  ?Per Caryn Bee, NP, involuntary this pt continues to require psychiatric hospitalization at this time.  At 16:12 Shelly calls from [facility] to report that pt has been accepted to their [facility] by Dr Carmela Rima.  Fredna Dow concurs with this decision.  EDP Pricilla Loveless, MD and pt's nurse, Gabriel Earing, have been notified, and [facility] agrees to call report to [phone].  Pt is to be transported via [facility]. ? ?Doylene Canning ?Behavioral Health Coordinator ?[phone] ? ?

## 2021-03-21 NOTE — ED Notes (Signed)
Pt has not eaten or drank anything for several days. [facility] was contacted for report, and informed this nurse that they will not be able to accept them in that condition. MD notified.  ?

## 2021-03-21 NOTE — ED Notes (Signed)
Pt no longer laying on mattress on floor request to lay directly on floor with just pillow and cover.  Vitals updated ?

## 2021-03-21 NOTE — ED Notes (Addendum)
Patient did drink 1 cup of Sprite & 1 orange juice  ?

## 2021-03-21 NOTE — ED Notes (Addendum)
PT. RAISE HER BED UP HIGH. PT WAS TOLD SHE CANNOT HAVE HER BED UP DUE TO SAFETY  REASONS AND IF SHE DO IT AGAIN WE WILL TAKE HER BED OUT, SO SHE  RAISE IT UP AGAIN SO STAFF TOOK  HER BED OUT OF HER ROOM. SHE IS NOW SLEEPING ON A MATTRESS. ?

## 2021-03-21 NOTE — ED Notes (Signed)
CBG 127 after eating dinner ?

## 2021-03-21 NOTE — ED Notes (Signed)
Resting at this time

## 2021-03-21 NOTE — ED Notes (Signed)
Pt is resting in bed no signs of distress.  ?

## 2021-03-22 NOTE — Progress Notes (Signed)
CSW received a call from [facility] in reference to acceptance of this patient. It was reported that Pregnancy Results have not been faxed over to the hospital. CSW faxed updated lab results. ? ?Crissie Reese, MSW, LCSW-A, LCAS-A ?Phone: [phone] ?Disposition/TOC ?  ?

## 2021-03-22 NOTE — ED Notes (Signed)
Report given to [place]. Sheriff contacted to arrange transport. ?

## 2021-03-22 NOTE — ED Provider Notes (Signed)
Emergency Medicine Observation Re-evaluation Note ? ?[name] is a 21 y.o. female, seen on rounds today.  Pt initially presented to the ED for complaints of IVC ?Currently, the patient is sitting in her room. ? ?Physical Exam  ?BP 112/76 (BP Location: Right Arm)   Pulse 72   Temp (!) 97.3 ?F (36.3 ?C) (Oral)   Resp 18   Ht 5\' 3"  (1.6 m)   Wt 61.2 kg   SpO2 99%   BMI 23.90 kg/m?  ?Physical Exam ?General: resting comfortably, NAD ?Lungs: normal WOB ?Psych: currently calm and resting ? ?ED Course / MDM  ?EKG:EKG Interpretation ? ?Date/Time:  Thursday March 20 2021 16:28:25 EST ?Ventricular Rate:  54 ?PR Interval:  142 ?QRS Duration: 86 ?QT Interval:  390 ?QTC Calculation: 369 ?R Axis:   66 ?Text Interpretation: Sinus bradycardia Otherwise normal ECG When compared with ECG of 04-Feb-2021 04:17, PREVIOUS ECG IS PRESENT Since last tracing rate slower Otherwise no significant change Confirmed by 06-Feb-2021 [phone]) on 03/20/2021 4:34:21 PM ? ?I have reviewed the labs performed to date as well as medications administered while in observation.  Recent changes in the last 24 hours include none. ? ?Plan  ?Current plan is for psychiatric inpatient placement and care. ?Kiara [name] is under involuntary commitment. ?  ? ?  ?Dawayne Patricia, DO ?03/22/21 0732 ? ?

## 2021-04-17 ENCOUNTER — Ambulatory Visit (HOSPITAL_COMMUNITY): Payer: Medicaid Other

## 2021-06-14 ENCOUNTER — Other Ambulatory Visit: Payer: Self-pay

## 2021-06-14 ENCOUNTER — Emergency Department (HOSPITAL_COMMUNITY)
Admission: EM | Admit: 2021-06-14 | Discharge: 2021-06-14 | Disposition: A | Discharge status: Home / Self Care | Payer: Medicaid Other | Attending: Emergency Medicine | Admitting: Emergency Medicine

## 2021-06-14 ENCOUNTER — Encounter (HOSPITAL_COMMUNITY): Payer: Self-pay | Admitting: Emergency Medicine

## 2021-06-14 DIAGNOSIS — H60502 Unspecified acute noninfective otitis externa, left ear: Secondary | ICD-10-CM | POA: Insufficient documentation

## 2021-06-14 DIAGNOSIS — H9202 Otalgia, left ear: Secondary | ICD-10-CM | POA: Diagnosis present

## 2021-06-14 MED ORDER — NEOMYCIN-POLYMYXIN-HC 3.5-10000-1 OT SUSP: 4.0000 [drp] | Freq: Three times a day (TID) | OTIC | 0 refills | Status: AC

## 2021-06-14 NOTE — ED Triage Notes (Signed)
C/o bug in L ear since last night.  States she can feel it moving around.

## 2021-06-14 NOTE — ED Provider Notes (Signed)
[facility] EMERGENCY DEPARTMENT Provider Note   CSN: [id number] Arrival date & time: 06/14/21  0654     History  Chief Complaint  Patient presents with   Foreign Body in Ear    Kiara [name] is a 21 y.o. female.  Patient with left ear pain.  Started last night.  Thinks something might be in it.  Recent exposure to pool.  Nothing makes it worse or better.  No major medical problems.  The history is provided by the patient.      Home Medications Prior to Admission medications   Medication Sig Start Date End Date Taking? Authorizing Provider  neomycin-polymyxin-hydrocortisone (CORTISPORIN) 3.5-10000-1 OTIC suspension Place 4 drops into the left ear 3 (three) times daily for 5 days. 06/14/21 06/19/21 Yes Lionel Woodberry, DO  mebendazole (VERMOX) 100 MG chewable tablet Take one tablet PO once and repeat once in 2 weeks Patient not taking: Reported on 03/21/2021 02/23/21   Renne Crigler, PA-C  OLANZapine zydis (ZYPREXA) 10 MG disintegrating tablet Take 10 mg by mouth at bedtime. Patient not taking: Reported on 03/21/2021 11/24/20   [provider]      Allergies    Patient has no known allergies.    Review of Systems   Review of Systems  Physical Exam Updated Vital Signs  ED Triage Vitals  Enc Vitals Group     BP 06/14/21 0715 116/80     Pulse Rate 06/14/21 0715 80     Resp 06/14/21 0715 17     Temp 06/14/21 0715 98.5 F (36.9 C)     Temp Source 06/14/21 0715 Oral     SpO2 06/14/21 0715 100 %     Weight --      Height --      Head Circumference --      Peak Flow --      Pain Score 06/14/21 0714 5     Pain Loc --      Pain Edu? --      Excl. in [place]? --     Physical Exam HENT:     Head: Normocephalic.     Right Ear: Tympanic membrane, ear canal and external ear normal. There is no impacted cerumen.     Left Ear: Tympanic membrane normal.     Ears:     Comments: Some inflammation and debris in the left external ear canal, no foreign body     Nose: Nose normal.     Mouth/Throat:     Mouth: Mucous membranes are moist.     Pharynx: No oropharyngeal exudate or posterior oropharyngeal erythema.  Neurological:     Mental Status: She is alert.    ED Results / Procedures / Treatments   Labs (all labs ordered are listed, but only abnormal results are displayed) Labs Reviewed - No data to display  EKG None  Radiology No results found.  Procedures Procedures    Medications Ordered in ED Medications - No data to display  ED Course/ Medical Decision Making/ A&P                           Medical Decision Making Risk Prescription drug management.   Kiara [name] is here with left ear discomfort.  Overall seems to be an otitis externa.  I do not see any foreign body.  No concern for mastoiditis or acute otitis media.  We will treat with antibiotic drops.  Discharged in good condition.  This chart was dictated using voice recognition software.  Despite best efforts to proofread,  errors can occur which can change the documentation meaning.         Final Clinical Impression(s) / ED Diagnoses Final diagnoses:  Acute otitis externa of left ear, unspecified type    Rx / DC Orders ED Discharge Orders          Ordered    neomycin-polymyxin-hydrocortisone (CORTISPORIN) 3.5-10000-1 OTIC suspension  3 times daily        06/14/21 0808              Virgina Norfolk, DO 06/14/21 [phone]

## 2021-10-15 ENCOUNTER — Encounter (HOSPITAL_COMMUNITY): Payer: Self-pay | Admitting: Emergency Medicine

## 2021-10-15 ENCOUNTER — Ambulatory Visit (HOSPITAL_COMMUNITY)
Admission: EM | Admit: 2021-10-15 | Discharge: 2021-10-15 | Disposition: A | Discharge status: Home / Self Care | Payer: Medicaid Other | Attending: Emergency Medicine | Admitting: Emergency Medicine

## 2021-10-15 DIAGNOSIS — K529 Noninfective gastroenteritis and colitis, unspecified: Secondary | ICD-10-CM

## 2021-10-15 LAB — POC URINE PREG, ED: Preg Test, Ur: NEGATIVE

## 2021-10-15 MED ORDER — LOPERAMIDE HCL 2 MG PO CAPS: 2.0000 mg | ORAL_CAPSULE | Freq: Four times a day (QID) | ORAL | 0 refills | Status: DC | PRN

## 2021-10-15 MED ORDER — AZITHROMYCIN 500 MG PO TABS
500.0000 mg | ORAL_TABLET | Freq: Every day | ORAL | 0 refills | Status: AC
Start: 2021-10-15 — End: 2021-10-18

## 2021-10-15 NOTE — Discharge Instructions (Addendum)
On exam your stomach and intestines is rumbling but there is no pain to your abdomen have a low suspicion of a more serious organ involvement and symptoms may be related to a bacteria that you are exposed through through food  Urine pregnancy test is negative  Take azithromycin every morning for 3 day  You can use Imodium to help with diarrhea, and be mindful over use of this medication may cause opposite effect constipation  You can use over-the-counter ibuprofen or Tylenol, which ever you have at home, to help manage fevers  Continue to promote hydration throughout the day by using electrolyte replacement solution such as Gatorade, body armor, Pedialyte, which ever you have at home  Try eating bland foods such as bread, rice, toast, fruit which are easier on the stomach to digest, avoid foods that are overly spicy, overly seasoned or greasy

## 2021-10-15 NOTE — ED Provider Notes (Addendum)
[facility]    CSN: [id number] Arrival date & time: 10/15/21  0840      History   Chief Complaint Chief Complaint  Patient presents with   Abdominal Pain   Diarrhea    HPI Kiara [name] is a 21 y.o. female.   Patient presents with lower abdominal pain and diarrhea for 5 days beginning after eating chicken.  Endorses symptoms started approximately 1 or 2 afterwards after consumption, other friends ate food but did not become ill.  Endorses soft brown diarrhea occurring at least 2-3 times per day.  Vomiting 4 days ago but has resolved.  Abdominal pain is described as intermittent and cramping, worsen prior to diarrhea occurring.  Associated bloating.  Denies fevers, chills or URI symptoms.    Requesting pregnancy test.  Last menstrual period mid September.  Sexually active, no condom use. History reviewed. No pertinent past medical history.  Patient Active Problem List   Diagnosis Date Noted   Homelessness 06/26/2020   Brief psychotic disorder ([place])    Unspecified mood (affective) disorder ([place]) 04/10/2020    History reviewed. No pertinent surgical history.  OB History   No obstetric history on file.      Home Medications    Prior to Admission medications   Medication Sig Start Date End Date Taking? Authorizing Provider  mebendazole (VERMOX) 100 MG chewable tablet Take one tablet PO once and repeat once in 2 weeks Patient not taking: Reported on 03/21/2021 02/23/21   Carlisle Cater, PA-C  OLANZapine zydis (ZYPREXA) 10 MG disintegrating tablet Take 10 mg by mouth at bedtime. Patient not taking: Reported on 03/21/2021 11/24/20   [provider]    Family History History reviewed. No pertinent family history.  Social History Social History   Tobacco Use   Smoking status: Every Day    Types: Cigarettes   Smokeless tobacco: Never  Vaping Use   Vaping Use: Some days  Substance Use Topics   Alcohol use: Yes   Drug use: Yes    Types: Marijuana      Allergies   Patient has no known allergies.   Review of Systems Review of Systems  Constitutional: Negative.   Respiratory: Negative.    Cardiovascular: Negative.   Gastrointestinal:  Positive for abdominal pain and diarrhea. Negative for abdominal distention, anal bleeding, blood in stool, constipation, nausea, rectal pain and vomiting.  Genitourinary: Negative.   Skin: Negative.      Physical Exam Triage Vital Signs ED Triage Vitals  Enc Vitals Group     BP 10/15/21 1003 105/61     Pulse Rate 10/15/21 1003 66     Resp 10/15/21 1003 17     Temp 10/15/21 1003 98 F (36.7 C)     Temp Source 10/15/21 1003 Oral     SpO2 10/15/21 1003 100 %     Weight --      Height --      Head Circumference --      Peak Flow --      Pain Score 10/15/21 1002 0     Pain Loc --      Pain Edu? --      Excl. in [place]? --    No data found.  Updated Vital Signs BP 105/61 (BP Location: Left Arm)   Pulse 66   Temp 98 F (36.7 C) (Oral)   Resp 17   SpO2 100%   Visual Acuity Right Eye Distance:   Left Eye Distance:   Bilateral Distance:  Right Eye Near:   Left Eye Near:    Bilateral Near:     Physical Exam Constitutional:      Appearance: She is well-developed.  HENT:     Head: Normocephalic.  Eyes:     Extraocular Movements: Extraocular movements intact.  Pulmonary:     Effort: Pulmonary effort is normal.  Abdominal:     General: Abdomen is flat. Bowel sounds are increased.     Palpations: Abdomen is soft.     Tenderness: There is no abdominal tenderness.  Skin:    General: Skin is warm and dry.  Neurological:     General: No focal deficit present.     Mental Status: She is alert and oriented to person, place, and time.  Psychiatric:        Mood and Affect: Mood normal.        Behavior: Behavior normal.      UC Treatments / Results  Labs (all labs ordered are listed, but only abnormal results are displayed) Labs Reviewed - No data to  display  EKG   Radiology No results found.  Procedures Procedures (including critical care time)  Medications Ordered in UC Medications - No data to display  Initial Impression / Assessment and Plan / UC Course  I have reviewed the triage vital signs and the nursing notes.  Pertinent labs & imaging results that were available during my care of the patient were reviewed by me and considered in my medical decision making (see chart for details).  Gastroenteritis  Urine pregnancy test negative , symptoms have been present for 5 days without resolution and started on antibiotics, azithromycin and Imodium ordered, on exam there is no tenderness to the abdomen and bowel sounds are increased, low suspicion for an acute abdomen, advised bland diet and increase fluid intake for supportive measures, may follow-up if symptoms persist or worsen Final Clinical Impressions(s) / UC Diagnoses   Final diagnoses:  None   Discharge Instructions   None    ED Prescriptions   None    PDMP not reviewed this encounter.   Hans Eden, NP 10/15/21 1432    Hans Eden, NP 10/15/21 1432

## 2021-10-15 NOTE — ED Triage Notes (Addendum)
Pt reports generalized lower abdominal pain and diarrhea 1 hr after eating chicken on Friday night. States abdominal pain is sometimes a cramping sensation and feels like she has a virus. Also reports abdominal pain usually begins around mid day and last throughout the night, Denies fever.

## 2021-11-22 ENCOUNTER — Emergency Department (HOSPITAL_COMMUNITY)
Admission: EM | Admit: 2021-11-22 | Discharge: 2021-11-22 | Discharge status: Against Medical Advice | Payer: Medicaid Other | Attending: Emergency Medicine | Admitting: Emergency Medicine

## 2021-11-22 ENCOUNTER — Encounter (HOSPITAL_COMMUNITY): Payer: Self-pay | Admitting: Emergency Medicine

## 2021-11-22 ENCOUNTER — Other Ambulatory Visit: Payer: Self-pay

## 2021-11-22 ENCOUNTER — Emergency Department (HOSPITAL_COMMUNITY)
Admission: EM | Admit: 2021-11-22 | Discharge: 2021-11-22 | Discharge status: Against Medical Advice | Payer: Medicaid Other | Source: Home / Self Care

## 2021-11-22 DIAGNOSIS — Z5321 Procedure and treatment not carried out due to patient leaving prior to being seen by health care provider: Secondary | ICD-10-CM | POA: Insufficient documentation

## 2021-11-22 DIAGNOSIS — R110 Nausea: Secondary | ICD-10-CM | POA: Insufficient documentation

## 2021-11-22 DIAGNOSIS — R109 Unspecified abdominal pain: Secondary | ICD-10-CM | POA: Diagnosis present

## 2021-11-22 LAB — URINALYSIS, ROUTINE W REFLEX MICROSCOPIC
Bilirubin Urine: NEGATIVE
Glucose, UA: NEGATIVE mg/dL
Ketones, ur: 80 mg/dL — AB
Nitrite: NEGATIVE
Protein, ur: 100 mg/dL — AB
RBC / HPF: >50 RBC/hpf — ABNORMAL HIGH (ref 0–5)
Specific Gravity, Urine: 1.029 (ref 1.005–1.030)
pH: 5 (ref 5.0–8.0)

## 2021-11-22 LAB — COMPREHENSIVE METABOLIC PANEL
ALT: 14 U/L (ref 0–44)
AST: 23 U/L (ref 15–41)
Albumin: 3.9 g/dL (ref 3.5–5.0)
Alkaline Phosphatase: 63 U/L (ref 38–126)
Anion gap: 12 (ref 5–15)
BUN: 5 mg/dL — ABNORMAL LOW (ref 6–20)
CO2: 24 mmol/L (ref 22–32)
Calcium: 9 mg/dL (ref 8.9–10.3)
Chloride: 102 mmol/L (ref 98–111)
Creatinine, Ser: 0.85 mg/dL (ref 0.44–1.00)
GFR, Estimated: >60 mL/min (ref >60)
Glucose, Bld: 85 mg/dL (ref 70–99)
Potassium: 3.5 mmol/L (ref 3.5–5.1)
Sodium: 138 mmol/L (ref 135–145)
Total Bilirubin: 0.5 mg/dL (ref 0.3–1.2)
Total Protein: 7.4 g/dL (ref 6.5–8.1)

## 2021-11-22 LAB — CBC
HCT: 39.8 % (ref 36.0–46.0)
Hemoglobin: 13.5 g/dL (ref 12.0–15.0)
MCH: 30.5 pg (ref 26.0–34.0)
MCHC: 33.9 g/dL (ref 30.0–36.0)
MCV: 90 fL (ref 80.0–100.0)
Platelets: 244 10*3/uL (ref 150–400)
RBC: 4.42 MIL/uL (ref 3.87–5.11)
RDW: 14.2 % (ref 11.5–15.5)
WBC: 11.7 10*3/uL — ABNORMAL HIGH (ref 4.0–10.5)
nRBC: 0 % (ref 0.0–0.2)

## 2021-11-22 LAB — I-STAT BETA HCG BLOOD, ED (MC, WL, AP ONLY): I-stat hCG, quantitative: <5 m[IU]/mL (ref <5)

## 2021-11-22 LAB — LIPASE, BLOOD: Lipase: 26 U/L (ref 11–51)

## 2021-11-22 NOTE — ED Provider Triage Note (Signed)
Emergency Medicine Provider Triage Evaluation Note  Kiara [name] , [name] 21 y.o. female  was evaluated in triage.  Pt complains of nausea. Was here earlier and eloped from the lobby. She was able to eat [name] cheeseburger for lunch without difficulty. Sx started after smoking marijuana. No current emesis  Review of Systems  Positive: emesis Negative:   Physical Exam  There were no vitals taken for this visit. Gen:   Awake, no distress   Resp:  Normal effort  MSK:   Moves extremities without difficulty  Other:    Medical Decision Making  Medically screening exam initiated at 2:33 PM.  Appropriate orders placed.  Kiara [name] was informed that the remainder of the evaluation will be completed by another provider, this initial triage assessment does not replace that evaluation, and the importance of remaining in the ED until their evaluation is complete.  Nausea, Was here earlier and had labs, UA  Will not repeat at this time.  Abd soft, non peritoneal    Kiara [name] A, PA-C 11/22/21 1434

## 2021-11-22 NOTE — ED Triage Notes (Signed)
Patient here with abdominal pain stating "feels like a soreness on the inside of my stomach not the outside" states she ate an apple, chicken biscuit yesterday at 11 pm and started feeling pain to her lower abdomen afterwards. Denies n/v/d, urinary symptoms.

## 2021-11-22 NOTE — ED Triage Notes (Signed)
Patient here after she left earlier to go to her house to eat, complaining still of soreness in her stomach. Endorses feeling a little dizzy after she smoked some CBD earlier.

## 2021-11-22 NOTE — ED Notes (Signed)
Pt went to cafeteria 

## 2021-11-22 NOTE — ED Notes (Signed)
Called for triage x1-no answer. 

## 2021-11-22 NOTE — ED Notes (Signed)
Called pt x3 for room, no response. Pt did come to this NT expressing about leaving earlier and this NT convinced pt to stay because there was only two pt's ahead of pt. Pt agreed and sat down.

## 2021-11-22 NOTE — ED Notes (Signed)
Pt called for VS, no response. Assumed LWBS.  

## 2021-11-23 ENCOUNTER — Other Ambulatory Visit: Payer: Self-pay

## 2021-11-23 ENCOUNTER — Encounter (HOSPITAL_COMMUNITY): Payer: Self-pay | Admitting: Emergency Medicine

## 2021-11-23 ENCOUNTER — Ambulatory Visit (HOSPITAL_COMMUNITY)
Admission: EM | Admit: 2021-11-23 | Discharge: 2021-11-23 | Disposition: A | Discharge status: Home / Self Care | Payer: Medicaid Other | Attending: Physician Assistant | Admitting: Physician Assistant

## 2021-11-23 DIAGNOSIS — R112 Nausea with vomiting, unspecified: Secondary | ICD-10-CM

## 2021-11-23 DIAGNOSIS — R1030 Lower abdominal pain, unspecified: Secondary | ICD-10-CM | POA: Diagnosis not present

## 2021-11-23 MED ORDER — NAPROXEN SODIUM 550 MG PO TABS: 550.0000 mg | ORAL_TABLET | Freq: Two times a day (BID) | ORAL | 0 refills | Status: DC

## 2021-11-23 MED ORDER — ONDANSETRON 4 MG PO TBDP
4.0000 mg | ORAL_TABLET | Freq: Once | ORAL | Status: AC
  Administered 2021-11-23: 4 mg via ORAL

## 2021-11-23 MED ORDER — KETOROLAC TROMETHAMINE 30 MG/ML IJ SOLN
INTRAMUSCULAR | Status: AC
  Filled 2021-11-23: qty 1

## 2021-11-23 MED ORDER — CYCLOBENZAPRINE HCL 5 MG PO TABS: 5.0000 mg | ORAL_TABLET | Freq: Three times a day (TID) | ORAL | 0 refills | Status: DC | PRN

## 2021-11-23 MED ORDER — KETOROLAC TROMETHAMINE 30 MG/ML IJ SOLN
30.0000 mg | Freq: Once | INTRAMUSCULAR | Status: AC
  Administered 2021-11-23: 30 mg via INTRAMUSCULAR

## 2021-11-23 MED ORDER — ONDANSETRON HCL 4 MG PO TABS: 4.0000 mg | ORAL_TABLET | Freq: Three times a day (TID) | ORAL | 0 refills | Status: DC | PRN

## 2021-11-23 MED ORDER — ONDANSETRON 4 MG PO TBDP
ORAL_TABLET | ORAL | Status: AC
  Filled 2021-11-23: qty 1

## 2021-11-23 NOTE — Discharge Instructions (Signed)
Advised to take the Anaprox DS every 12 hours with food to help decrease abdominal pain. Advised take the Flexeril 5 mg every 8 hours on a regular basis as this will help reduce cramping. Advised take the Zofran 4 mg every 6 hours as needed for nausea to help calm the stomach. Advised to follow-up with PCP or return to urgent care if symptoms fail to improve over the next several days.

## 2021-11-23 NOTE — ED Provider Notes (Signed)
[facility]    CSN: [id number] Arrival date & time: 11/23/21  1017      History   Chief Complaint Chief Complaint  Patient presents with   Abdominal Pain    HPI Kiara [name] is a 21 y.o. female.   21 year old female with abdominal pain and nausea.  Patient indicates that she started her period 3 days ago, for the past 2 days she has been having severe lower abdominal pain with cramping.  Patient indicates that her period has continued to 2 days past when it usually stops.  Patient indicates she has had moderately severe cramping with her periods before but this is little bit worse than what she is used to.  She indicates that this morning she had some nausea associated and has thrown up twice due to the cramping and discomfort.  Patient indicates that she went to the emergency room last night due to her pain and had some labs drawn but left before being seen.  (The CMP is normal, pregnancy test negative, CBC with mild elevation of WBC at 11.7, urinalysis was normal except for blood) patient indicates she is not having any fever or chills.  She does indicate that she is having some mild cough and occasional wheezing when she breathes and coughing causes her stomach to hurt worse.  She indicates she has taken some ibuprofen and a muscle relaxer that was given to her by a friend which has given her mild relief from her cramping.  She relates she is tolerating fluids well.  She does relate the pain as being a 7 on a scale of 1-10.   Abdominal Pain   History reviewed. No pertinent past medical history.  Patient Active Problem List   Diagnosis Date Noted   Homelessness 06/26/2020   Brief psychotic disorder ([facility])    Unspecified mood (affective) disorder ([facility]) 04/10/2020    History reviewed. No pertinent surgical history.  OB History   No obstetric history on file.      Home Medications    Prior to Admission medications   Medication Sig Start Date End Date Taking?  Authorizing Provider  cyclobenzaprine (FLEXERIL) 5 MG tablet Take 1 tablet (5 mg total) by mouth 3 (three) times daily as needed for muscle spasms. For cramping. 11/23/21  Yes Ellsworth Lennox, PA-C  naproxen sodium (ANAPROX DS) 550 MG tablet Take 1 tablet (550 mg total) by mouth 2 (two) times daily with a meal. For pain. 11/23/21  Yes Ellsworth Lennox, PA-C  ondansetron (ZOFRAN) 4 MG tablet Take 1 tablet (4 mg total) by mouth every 8 (eight) hours as needed for nausea or vomiting. 11/23/21  Yes Ellsworth Lennox, PA-C  loperamide (IMODIUM) 2 MG capsule Take 1 capsule (2 mg total) by mouth 4 (four) times daily as needed for diarrhea or loose stools. 10/15/21   Valinda Hoar, NP  mebendazole (VERMOX) 100 MG chewable tablet Take one tablet PO once and repeat once in 2 weeks Patient not taking: Reported on 03/21/2021 02/23/21   Renne Crigler, PA-C  OLANZapine zydis (ZYPREXA) 10 MG disintegrating tablet Take 10 mg by mouth at bedtime. Patient not taking: Reported on 03/21/2021 11/24/20   [provider]    Family History History reviewed. No pertinent family history.  Social History Social History   Tobacco Use   Smoking status: Every Day    Types: Cigarettes   Smokeless tobacco: Never  Vaping Use   Vaping Use: Some days  Substance Use Topics   Alcohol use:  Yes   Drug use: Yes    Types: Marijuana     Allergies   Patient has no known allergies.   Review of Systems Review of Systems  Gastrointestinal:  Positive for abdominal pain (across lower abdomen).     Physical Exam Triage Vital Signs ED Triage Vitals  Enc Vitals Group     BP 11/23/21 1110 127/89     Pulse Rate 11/23/21 1110 60     Resp 11/23/21 1110 20     Temp 11/23/21 1110 98.7 F (37.1 C)     Temp Source 11/23/21 1110 Oral     SpO2 11/23/21 1110 100 %     Weight --      Height --      Head Circumference --      Peak Flow --      Pain Score 11/23/21 1106 8     Pain Loc --      Pain Edu? --      Excl. in [place]? --     No data found.  Updated Vital Signs BP 127/89 (BP Location: Left Arm)   Pulse 60   Temp 98.7 F (37.1 C) (Oral)   Resp 20   LMP 11/13/2021   SpO2 100%   Visual Acuity Right Eye Distance:   Left Eye Distance:   Bilateral Distance:    Right Eye Near:   Left Eye Near:    Bilateral Near:     Physical Exam Constitutional:      Appearance: She is well-developed.  Cardiovascular:     Rate and Rhythm: Normal rate and regular rhythm.     Heart sounds: Normal heart sounds.  Pulmonary:     Effort: Pulmonary effort is normal.     Breath sounds: Normal breath sounds and air entry. No wheezing, rhonchi or rales.  Abdominal:     General: Abdomen is flat. Bowel sounds are normal.     Palpations: Abdomen is soft.     Tenderness: There is abdominal tenderness (mild) in the suprapubic area. There is no guarding or rebound.    Neurological:     Mental Status: She is alert.      UC Treatments / Results  Labs (all labs ordered are listed, but only abnormal results are displayed) Labs Reviewed - No data to display  EKG   Radiology No results found.  Procedures Procedures (including critical care time)  Medications Ordered in UC Medications  ketorolac (TORADOL) 30 MG/ML injection 30 mg (has no administration in time range)  ondansetron (ZOFRAN-ODT) disintegrating tablet 4 mg (has no administration in time range)    Initial Impression / Assessment and Plan / UC Course  I have reviewed the triage vital signs and the nursing notes.  Pertinent labs & imaging results that were available during my care of the patient were reviewed by me and considered in my medical decision making (see chart for details).    Plan: 1.  The lower abdominal pain will be treated with the following: A.  Toradol 30 mg given IM in the office. B.  Anaprox DS every 12 hours with food to help reduce pain. C.  Flexeril 5 mg every 8 hours to help reduce cramping. 2.  The nausea and vomiting be  treated with the following: A.  Zofran 4 mg given in the office today. 2.  Zofran 4 mg every 6 hours to help control nausea. 3.  Patient advised to follow-up PCP or return to urgent care if symptoms fail  to improve over the next several days. Final Clinical Impressions(s) / UC Diagnoses   Final diagnoses:  Lower abdominal pain  Nausea and vomiting, unspecified vomiting type     Discharge Instructions      Advised to take the Anaprox DS every 12 hours with food to help decrease abdominal pain. Advised take the Flexeril 5 mg every 8 hours on a regular basis as this will help reduce cramping. Advised take the Zofran 4 mg every 6 hours as needed for nausea to help calm the stomach. Advised to follow-up with PCP or return to urgent care if symptoms fail to improve over the next several days.    ED Prescriptions     Medication Sig Dispense Auth. Provider   naproxen sodium (ANAPROX DS) 550 MG tablet Take 1 tablet (550 mg total) by mouth 2 (two) times daily with a meal. For pain. 14 tablet Ellsworth Lennox, PA-C   cyclobenzaprine (FLEXERIL) 5 MG tablet Take 1 tablet (5 mg total) by mouth 3 (three) times daily as needed for muscle spasms. For cramping. 30 tablet Ellsworth Lennox, PA-C   ondansetron (ZOFRAN) 4 MG tablet Take 1 tablet (4 mg total) by mouth every 8 (eight) hours as needed for nausea or vomiting. 20 tablet Ellsworth Lennox, PA-C      PDMP not reviewed this encounter.   Ellsworth Lennox, PA-C 11/23/21 1135

## 2021-11-23 NOTE — ED Triage Notes (Addendum)
Onset of abdominal pain started with ending of menstrual cycle onset 3 days ago.  Patient reports taking medicine for muscle spasms, advil.  Reports vomiting, 2 times today.  Denies diarrhea  Patient has significant abdominal pain with menstrual cycles, but this time is last much longer than pain associated with cycles.  Patient went to ED yesterday.  Reports having blood and urine samples obtained, did not wait to be seen

## 2021-11-27 ENCOUNTER — Ambulatory Visit (INDEPENDENT_AMBULATORY_CARE_PROVIDER_SITE_OTHER): Payer: Medicaid Other

## 2021-11-27 ENCOUNTER — Encounter (HOSPITAL_COMMUNITY): Payer: Self-pay | Admitting: *Deleted

## 2021-11-27 ENCOUNTER — Ambulatory Visit (HOSPITAL_COMMUNITY)
Admission: EM | Admit: 2021-11-27 | Discharge: 2021-11-27 | Disposition: A | Discharge status: Home / Self Care | Payer: Medicaid Other | Attending: Internal Medicine | Admitting: Internal Medicine

## 2021-11-27 DIAGNOSIS — R1084 Generalized abdominal pain: Secondary | ICD-10-CM | POA: Insufficient documentation

## 2021-11-27 DIAGNOSIS — R1030 Lower abdominal pain, unspecified: Secondary | ICD-10-CM

## 2021-11-27 DIAGNOSIS — K5900 Constipation, unspecified: Secondary | ICD-10-CM

## 2021-11-27 DIAGNOSIS — N898 Other specified noninflammatory disorders of vagina: Secondary | ICD-10-CM | POA: Diagnosis not present

## 2021-11-27 LAB — POC URINE PREG, ED: Preg Test, Ur: NEGATIVE

## 2021-11-27 MED ORDER — KETOROLAC TROMETHAMINE 30 MG/ML IJ SOLN
INTRAMUSCULAR | Status: AC
  Filled 2021-11-27: qty 1

## 2021-11-27 MED ORDER — ACETAMINOPHEN 325 MG PO TABS
975.0000 mg | ORAL_TABLET | Freq: Once | ORAL | Status: AC
  Administered 2021-11-27: 975 mg via ORAL

## 2021-11-27 MED ORDER — KETOROLAC TROMETHAMINE 30 MG/ML IJ SOLN
30.0000 mg | Freq: Once | INTRAMUSCULAR | Status: AC
  Administered 2021-11-27: 30 mg via INTRAMUSCULAR

## 2021-11-27 MED ORDER — POLYETHYLENE GLYCOL 3350 17 GM/SCOOP PO POWD: 1.0000 | Freq: Once | ORAL | 0 refills | Status: AC

## 2021-11-27 MED ORDER — ACETAMINOPHEN 325 MG PO TABS
ORAL_TABLET | ORAL | Status: AC
  Filled 2021-11-27: qty 3
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
## 2021-11-27 NOTE — Discharge Instructions (Signed)
Continue taking naproxen and Zofran as needed for pain to the abdomen and nausea/vomiting.  Do not take any naproxen/Aleve until tomorrow since I gave you an injection of ketorolac in the clinic to help with your abdominal pain.  These medications are similar and I do not want you taking too much of both of them.  Your x-ray shows that you are constipated. Start taking MiraLAX twice daily until you are able to have a soft normal bowel movement.  Once you are able to have a soft normal bowel movement, decrease the MiraLAX to once daily for the next 3 days then use as needed. Increase the amount of fiber you are eating by eating more fruits, vegetables, and whole grains.  Increase your water intake to at least 8 cups of water per day to maintain good hydration. Take Colace stool softener daily to prevent constipation in the future.  If you have not had a bowel movement in the next 2 to 3 days, please return to urgent care.  If you develop any new or worsening symptoms that are severe, please go to the emergency room for further evaluation.  I hope you feel better!

## 2021-11-28 ENCOUNTER — Telehealth (HOSPITAL_COMMUNITY): Payer: Self-pay | Admitting: Emergency Medicine

## 2021-11-28 LAB — CERVICOVAGINAL ANCILLARY ONLY
Bacterial Vaginitis (gardnerella): POSITIVE — AB
Candida Glabrata: NEGATIVE
Candida Vaginitis: NEGATIVE
Chlamydia: POSITIVE — AB
Comment: NEGATIVE
Comment: NEGATIVE
Comment: NEGATIVE
Comment: NEGATIVE
Comment: NEGATIVE
Comment: NORMAL
Neisseria Gonorrhea: POSITIVE — AB
Trichomonas: NEGATIVE

## 2021-11-28 MED ORDER — METRONIDAZOLE 0.75 % VA GEL: 1.0000 | Freq: Every day | VAGINAL | 0 refills | Status: AC

## 2021-11-28 MED ORDER — DOXYCYCLINE HYCLATE 100 MG PO CAPS: 100.0000 mg | ORAL_CAPSULE | Freq: Two times a day (BID) | ORAL | 0 refills | Status: AC

## 2021-11-28 NOTE — Telephone Encounter (Signed)
Per protocol, patient will need treatment with IM Rocephin 500mg  for positive Gonorrhea. Will also need treatment with Doxycycline and Metrogel.   Attempted to reach patient x 1, unable to LVM Prescription sent to pharmacy on file

## 2021-12-07 ENCOUNTER — Other Ambulatory Visit: Payer: Self-pay

## 2021-12-07 ENCOUNTER — Encounter (HOSPITAL_COMMUNITY): Payer: Self-pay

## 2021-12-07 ENCOUNTER — Inpatient Hospital Stay (HOSPITAL_COMMUNITY)
Admission: EM | Admit: 2021-12-07 | Discharge: 2021-12-13 | DRG: 759 | Disposition: A | Discharge status: Home / Self Care | Payer: Medicaid Other | Attending: Obstetrics & Gynecology | Admitting: Obstetrics & Gynecology

## 2021-12-07 DIAGNOSIS — Z1152 Encounter for screening for COVID-19: Secondary | ICD-10-CM

## 2021-12-07 DIAGNOSIS — A5611 Chlamydial female pelvic inflammatory disease: Secondary | ICD-10-CM | POA: Diagnosis present

## 2021-12-07 DIAGNOSIS — F17210 Nicotine dependence, cigarettes, uncomplicated: Secondary | ICD-10-CM | POA: Diagnosis present

## 2021-12-07 DIAGNOSIS — N7093 Salpingitis and oophoritis, unspecified: Principal | ICD-10-CM | POA: Diagnosis present

## 2021-12-07 DIAGNOSIS — A5424 Gonococcal female pelvic inflammatory disease: Principal | ICD-10-CM | POA: Diagnosis present

## 2021-12-07 HISTORY — DX: Hidradenitis suppurativa: L73.2

## 2021-12-07 NOTE — ED Triage Notes (Signed)
The patient has been to [facility] 4 times in the last 2 weeks, for abdominal pain, groin pain, and cold symptoms, she states that she has not had any relief with the medications she has been given. She also states that she had one episode of vomiting earlier today.

## 2021-12-08 ENCOUNTER — Emergency Department (HOSPITAL_COMMUNITY): Payer: Medicaid Other

## 2021-12-08 ENCOUNTER — Encounter (HOSPITAL_COMMUNITY): Payer: Self-pay

## 2021-12-08 DIAGNOSIS — J101 Influenza due to other identified influenza virus with other respiratory manifestations: Secondary | ICD-10-CM

## 2021-12-08 DIAGNOSIS — N730 Acute parametritis and pelvic cellulitis: Secondary | ICD-10-CM | POA: Diagnosis not present

## 2021-12-08 DIAGNOSIS — Z1152 Encounter for screening for COVID-19: Secondary | ICD-10-CM | POA: Diagnosis not present

## 2021-12-08 DIAGNOSIS — F17210 Nicotine dependence, cigarettes, uncomplicated: Secondary | ICD-10-CM | POA: Diagnosis present

## 2021-12-08 DIAGNOSIS — N7093 Salpingitis and oophoritis, unspecified: Secondary | ICD-10-CM | POA: Diagnosis present

## 2021-12-08 DIAGNOSIS — A5424 Gonococcal female pelvic inflammatory disease: Secondary | ICD-10-CM | POA: Diagnosis present

## 2021-12-08 DIAGNOSIS — A5611 Chlamydial female pelvic inflammatory disease: Secondary | ICD-10-CM | POA: Diagnosis present

## 2021-12-08 LAB — URINALYSIS, ROUTINE W REFLEX MICROSCOPIC
Bacteria, UA: NONE SEEN
Bilirubin Urine: NEGATIVE
Glucose, UA: NEGATIVE mg/dL
Hgb urine dipstick: NEGATIVE
Ketones, ur: 80 mg/dL — AB
Nitrite: NEGATIVE
Protein, ur: NEGATIVE mg/dL
Specific Gravity, Urine: 1.017 (ref 1.005–1.030)
pH: 6 (ref 5.0–8.0)

## 2021-12-08 LAB — CBC
HCT: 40.8 % (ref 36.0–46.0)
Hemoglobin: 13.6 g/dL (ref 12.0–15.0)
MCH: 29 pg (ref 26.0–34.0)
MCHC: 33.3 g/dL (ref 30.0–36.0)
MCV: 87 fL (ref 80.0–100.0)
Platelets: 393 10*3/uL (ref 150–400)
RBC: 4.69 MIL/uL (ref 3.87–5.11)
RDW: 14 % (ref 11.5–15.5)
WBC: 12.2 10*3/uL — ABNORMAL HIGH (ref 4.0–10.5)
nRBC: 0 % (ref 0.0–0.2)

## 2021-12-08 LAB — COMPREHENSIVE METABOLIC PANEL
ALT: 33 U/L (ref 0–44)
AST: 39 U/L (ref 15–41)
Albumin: 3.4 g/dL — ABNORMAL LOW (ref 3.5–5.0)
Alkaline Phosphatase: 69 U/L (ref 38–126)
Anion gap: 13 (ref 5–15)
BUN: 9 mg/dL (ref 6–20)
CO2: 22 mmol/L (ref 22–32)
Calcium: 8.8 mg/dL — ABNORMAL LOW (ref 8.9–10.3)
Chloride: 99 mmol/L (ref 98–111)
Creatinine, Ser: 0.79 mg/dL (ref 0.44–1.00)
GFR, Estimated: >60 mL/min (ref >60)
Glucose, Bld: 89 mg/dL (ref 70–99)
Potassium: 3.5 mmol/L (ref 3.5–5.1)
Sodium: 134 mmol/L — ABNORMAL LOW (ref 135–145)
Total Bilirubin: 0.7 mg/dL (ref 0.3–1.2)
Total Protein: 8.5 g/dL — ABNORMAL HIGH (ref 6.5–8.1)

## 2021-12-08 LAB — RESP PANEL BY RT-PCR (FLU A&B, COVID) ARPGX2
Influenza A by PCR: POSITIVE — AB
Influenza B by PCR: NEGATIVE
SARS Coronavirus 2 by RT PCR: NEGATIVE

## 2021-12-08 LAB — LIPASE, BLOOD: Lipase: 28 U/L (ref 11–51)

## 2021-12-08 LAB — I-STAT BETA HCG BLOOD, ED (MC, WL, AP ONLY): I-stat hCG, quantitative: <5 m[IU]/mL (ref <5)

## 2021-12-08 MED ORDER — SODIUM CHLORIDE 0.9 % IV SOLN: 2.0000 g | INTRAVENOUS | Status: DC

## 2021-12-08 MED ORDER — ACETAMINOPHEN 325 MG PO TABS
650.0000 mg | ORAL_TABLET | Freq: Once | ORAL | Status: AC
  Administered 2021-12-08: 650 mg via ORAL
  Filled 2021-12-08: qty 2

## 2021-12-08 MED ORDER — SODIUM CHLORIDE 0.9 % IV SOLN
1.0000 g | Freq: Once | INTRAVENOUS | Status: AC
  Administered 2021-12-08: 1 g via INTRAVENOUS
  Filled 2021-12-08: qty 10

## 2021-12-08 MED ORDER — MORPHINE SULFATE (PF) 2 MG/ML IV SOLN
2.0000 mg | Freq: Once | INTRAVENOUS | Status: AC
  Administered 2021-12-08: 2 mg via INTRAVENOUS
  Filled 2021-12-08: qty 1

## 2021-12-08 MED ORDER — SODIUM CHLORIDE 0.9 % IV SOLN
2.0000 g | INTRAVENOUS | Status: DC
  Administered 2021-12-08 – 2021-12-10 (×3): 2 g via INTRAVENOUS
  Filled 2021-12-08 (×3): qty 20

## 2021-12-08 MED ORDER — SODIUM CHLORIDE 0.9 % IV BOLUS
1000.0000 mL | Freq: Once | INTRAVENOUS | Status: AC
  Administered 2021-12-08: 1000 mL via INTRAVENOUS

## 2021-12-08 MED ORDER — SODIUM CHLORIDE 0.9 % IV SOLN
100.0000 mg | Freq: Once | INTRAVENOUS | Status: AC
  Administered 2021-12-08: 100 mg via INTRAVENOUS
  Filled 2021-12-08: qty 100

## 2021-12-08 MED ORDER — OSELTAMIVIR PHOSPHATE 75 MG PO CAPS
75.0000 mg | ORAL_CAPSULE | Freq: Two times a day (BID) | ORAL | Status: AC
  Administered 2021-12-09 – 2021-12-13 (×10): 75 mg via ORAL
  Filled 2021-12-08 (×10): qty 1

## 2021-12-08 MED ORDER — ONDANSETRON HCL 4 MG/2ML IJ SOLN
4.0000 mg | Freq: Once | INTRAMUSCULAR | Status: DC
  Filled 2021-12-08: qty 2

## 2021-12-08 MED ORDER — METRONIDAZOLE 500 MG/100ML IV SOLN: 500.0000 mg | Freq: Two times a day (BID) | INTRAVENOUS | Status: DC

## 2021-12-08 MED ORDER — ONDANSETRON HCL 4 MG/2ML IJ SOLN
4.0000 mg | Freq: Once | INTRAMUSCULAR | Status: AC
  Administered 2021-12-08: 4 mg via INTRAVENOUS
  Filled 2021-12-08: qty 2

## 2021-12-08 MED ORDER — SODIUM CHLORIDE 0.9 % IV SOLN
100.0000 mg | Freq: Two times a day (BID) | INTRAVENOUS | Status: DC
  Administered 2021-12-08 – 2021-12-12 (×8): 100 mg via INTRAVENOUS
  Filled 2021-12-08 (×10): qty 100

## 2021-12-08 MED ORDER — ONDANSETRON HCL 4 MG/2ML IJ SOLN
4.0000 mg | Freq: Four times a day (QID) | INTRAMUSCULAR | Status: DC | PRN
  Administered 2021-12-08 – 2021-12-12 (×10): 4 mg via INTRAVENOUS
  Filled 2021-12-08 (×9): qty 2

## 2021-12-08 MED ORDER — HYDROMORPHONE HCL 1 MG/ML IJ SOLN
1.0000 mg | INTRAMUSCULAR | Status: DC | PRN
  Administered 2021-12-08 – 2021-12-12 (×17): 1 mg via INTRAVENOUS
  Filled 2021-12-08 (×18): qty 1

## 2021-12-08 MED ORDER — IOHEXOL 300 MG/ML  SOLN
100.0000 mL | Freq: Once | INTRAMUSCULAR | Status: AC | PRN
  Administered 2021-12-08: 100 mL via INTRAVENOUS

## 2021-12-08 MED ORDER — SODIUM CHLORIDE 0.9 % IV SOLN: Freq: Once | INTRAVENOUS | Status: AC

## 2021-12-08 MED ORDER — HYDROMORPHONE HCL 1 MG/ML IJ SOLN
1.0000 mg | Freq: Once | INTRAMUSCULAR | Status: AC
  Administered 2021-12-08: 1 mg via INTRAVENOUS
  Filled 2021-12-08: qty 1

## 2021-12-08 MED ORDER — MORPHINE SULFATE (PF) 2 MG/ML IV SOLN
2.0000 mg | INTRAVENOUS | Status: DC | PRN
  Administered 2021-12-08 (×2): 2 mg via INTRAVENOUS
  Filled 2021-12-08 (×3): qty 1

## 2021-12-08 MED ORDER — SODIUM CHLORIDE 0.9 % IV SOLN: INTRAVENOUS | Status: DC

## 2021-12-08 MED ORDER — METRONIDAZOLE 500 MG/100ML IV SOLN
500.0000 mg | Freq: Two times a day (BID) | INTRAVENOUS | Status: DC
  Administered 2021-12-08 – 2021-12-11 (×7): 500 mg via INTRAVENOUS
  Filled 2021-12-08 (×8): qty 100

## 2021-12-08 MED ORDER — SODIUM CHLORIDE 0.9 % IV SOLN
1.0000 g | Freq: Two times a day (BID) | INTRAVENOUS | Status: DC
  Administered 2021-12-08: 1 g via INTRAVENOUS
  Filled 2021-12-08: qty 10
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
## 2021-12-08 NOTE — H&P (Addendum)
GYN ADMIT History and Physical   HPI. 21 y.o. G0 admitted due to PID with TOA.  She notes that she has had worsening abdominal pain for the past week.  She has been in/out of ER since Nov 11th.  During ER visit on 11/16- STI screening was completed- positive gonorrhea/chlamydia and trich, which were not treated.    She rates her pain mostly on the right, 9/10.  Denies vomiting, notes only nausea 2/2 pain medication.  She does note some issues with constipation, last BM on 11/25.  Notes a foul-smelling vaginal odor with discharge since about the 10th or 11th.  Other than a cough, she notes no other acute complaints.  When pt is explaining her symptoms- story is disconnected.  Review of Systems - General ROS: negative for - chills or fever Psychological ROS: negative ENT ROS: negative for - headaches, nasal congestion, sneezing, or sore throat Hematological and Lymphatic ROS: negative for - bleeding problems Endocrine ROS: negative for - palpitations or skin changes Respiratory ROS: positive for - cough, no SOB or chest pain Cardiovascular ROS: no chest pain or dyspnea on exertion Gastrointestinal ROS: see HPI Genito-Urinary ROS: see HPI, denies urinary complaints Musculoskeletal ROS: notes right sided pain Neurological ROS: no TIA or stroke symptoms   OBHx:nullip GYNHx: menses regular each month- typically last for about 7 days using about 3 pads per day.  She did not a few "extra days" of spotting after her last period.  Typically female partners, but on occasion female partners.  Did not use protection Has not seen a gyn in the past  MedHx: none SxHx: wisdom teeth FHx: reports not knowing her family history very well SocHx: + tobacco use- about 4 cigs per day, +marijuana use, denies EtOH, denies drugs Meds: nothing regular- per ER visits, she was given Flexeril, naproxen, zofran ALL: NKDA  O: BP 136/81 (BP Location: Right Arm)   Pulse (!) 102   Temp 99.6 F (37.6 C) (Oral)   Resp  18   Ht 5\' 3"  (1.6 m)   Wt 61 kg   LMP 11/13/2021 Comment: Negative pregnancy test  SpO2 100%   BMI 23.82 kg/m  Gen: NAD, A&Ox3 Heart/Lungs: S1S2 RRR, CTAB Abdomen: flat, soft, involuntary guarding, +RLQ tenderness Pelvis: No external genital lesions/rashes. Vaginal mucosa moist pink well rugated  w/out lesions,  minimal discharge,  cervix visualized closed with no lesions, pap obtained, diffuse tenderness on bimanual exam with inability to complete exam Extrem: no edema, no calf tenderness LE bilaterally  Labs:  Results for orders placed or performed during the hospital encounter of 12/07/21 (from the past 24 hour(s))  Lipase, blood     Status: None   Collection Time: 12/08/21 12:11 AM  Result Value Ref Range   Lipase 28 11 - 51 U/L  Comprehensive metabolic panel     Status: Abnormal   Collection Time: 12/08/21 12:11 AM  Result Value Ref Range   Sodium 134 (L) 135 - 145 mmol/L   Potassium 3.5 3.5 - 5.1 mmol/L   Chloride 99 98 - 111 mmol/L   CO2 22 22 - 32 mmol/L   Glucose, Bld 89 70 - 99 mg/dL   BUN 9 6 - 20 mg/dL   Creatinine, Ser 12/10/21 0.44 - 1.00 mg/dL   Calcium 8.8 (L) 8.9 - 10.3 mg/dL   Total Protein 8.5 (H) 6.5 - 8.1 g/dL   Albumin 3.4 (L) 3.5 - 5.0 g/dL   AST 39 15 - 41 U/L   ALT 33  0 - 44 U/L   Alkaline Phosphatase 69 38 - 126 U/L   Total Bilirubin 0.7 0.3 - 1.2 mg/dL   GFR, Estimated >42 >70 mL/min   Anion gap 13 5 - 15  CBC     Status: Abnormal   Collection Time: 12/08/21 12:11 AM  Result Value Ref Range   WBC 12.2 (H) 4.0 - 10.5 K/uL   RBC 4.69 3.87 - 5.11 MIL/uL   Hemoglobin 13.6 12.0 - 15.0 g/dL   HCT 62.3 76.2 - 83.1 %   MCV 87.0 80.0 - 100.0 fL   MCH 29.0 26.0 - 34.0 pg   MCHC 33.3 30.0 - 36.0 g/dL   RDW 51.7 61.6 - 07.3 %   Platelets 393 150 - 400 K/uL   nRBC 0.0 0.0 - 0.2 %  Urinalysis, Routine w reflex microscopic     Status: Abnormal   Collection Time: 12/08/21 12:25 AM  Result Value Ref Range   Color, Urine YELLOW YELLOW   APPearance CLEAR  CLEAR   Specific Gravity, Urine 1.017 1.005 - 1.030   pH 6.0 5.0 - 8.0   Glucose, UA NEGATIVE NEGATIVE mg/dL   Hgb urine dipstick NEGATIVE NEGATIVE   Bilirubin Urine NEGATIVE NEGATIVE   Ketones, ur 80 (A) NEGATIVE mg/dL   Protein, ur NEGATIVE NEGATIVE mg/dL   Nitrite NEGATIVE NEGATIVE   Leukocytes,Ua MODERATE (A) NEGATIVE   RBC / HPF 0-5 0 - 5 RBC/hpf   WBC, UA 11-20 0 - 5 WBC/hpf   Bacteria, UA NONE SEEN NONE SEEN   Squamous Epithelial / LPF 0-5 0 - 5   Mucus PRESENT   I-Stat beta hCG blood, ED     Status: None   Collection Time: 12/08/21 12:26 AM  Result Value Ref Range   I-stat hCG, quantitative <5.0 <5 mIU/mL   Comment 3          Resp Panel by RT-PCR (Flu A&B, Covid) Anterior Nasal Swab     Status: Abnormal   Collection Time: 12/08/21 12:57 AM   Specimen: Anterior Nasal Swab  Result Value Ref Range   SARS Coronavirus 2 by RT PCR NEGATIVE NEGATIVE   Influenza A by PCR POSITIVE (A) NEGATIVE   Influenza B by PCR NEGATIVE NEGATIVE    Imaging: Abd/Pelvis CT- IMPRESSION: 1. Findings concerning for the presence of a right-sided tubo-ovarian abscess with associated right-sided hydrosalpinx. Further evaluation with pelvic ultrasound is recommended. 2. Partial small bowel obstruction involving the mid jejunum, which may be secondary to intermittent episodes of small bowel intussusception.  A/P: 21 y.o. G0 admitted for PID with TOA with known Chlamydia/Gonorrhea/BV -IV Rocephin/Doxy and Flagyl -NS @ 125cc/hr -IV Dilaudid for pain -spoke with general surgery regarding CT read.  Suspect that peristalsis was occurring at the time of imaging.  No immediate surgical intervention warranted -plan to advance diet as tolerated -CBC, HIV, RPR ordered for am -Pelvic US ordered -Influenza A- tamiflu ordered  Myna Hidalgo, DO Attending Obstetrician & Gynecologist, [facility], [facility]  [phone] [facility] Consult Attending  Monday-Friday 8am - 5pm) or [phone] [facility] Attending On Call all day, every day)

## 2021-12-08 NOTE — ED Notes (Signed)
Called [facility] for transport to [facility]

## 2021-12-09 ENCOUNTER — Inpatient Hospital Stay (HOSPITAL_COMMUNITY): Payer: Medicaid Other

## 2021-12-09 LAB — HIV ANTIBODY (ROUTINE TESTING W REFLEX): HIV Screen 4th Generation wRfx: NONREACTIVE

## 2021-12-09 LAB — CBC WITH DIFFERENTIAL/PLATELET
Abs Immature Granulocytes: 0.07 10*3/uL (ref 0.00–0.07)
Basophils Absolute: 0 10*3/uL (ref 0.0–0.1)
Basophils Relative: 0 %
Eosinophils Absolute: 0.1 10*3/uL (ref 0.0–0.5)
Eosinophils Relative: 1 %
HCT: 36.9 % (ref 36.0–46.0)
Hemoglobin: 12.2 g/dL (ref 12.0–15.0)
Immature Granulocytes: 1 %
Lymphocytes Relative: 13 %
Lymphs Abs: 1.9 10*3/uL (ref 0.7–4.0)
MCH: 29.2 pg (ref 26.0–34.0)
MCHC: 33.1 g/dL (ref 30.0–36.0)
MCV: 88.3 fL (ref 80.0–100.0)
Monocytes Absolute: 1.1 10*3/uL — ABNORMAL HIGH (ref 0.1–1.0)
Monocytes Relative: 8 %
Neutro Abs: 11.4 10*3/uL — ABNORMAL HIGH (ref 1.7–7.7)
Neutrophils Relative %: 77 %
Platelets: 410 10*3/uL — ABNORMAL HIGH (ref 150–400)
RBC: 4.18 MIL/uL (ref 3.87–5.11)
RDW: 14.1 % (ref 11.5–15.5)
WBC: 14.6 10*3/uL — ABNORMAL HIGH (ref 4.0–10.5)
nRBC: 0 % (ref 0.0–0.2)

## 2021-12-09 LAB — RPR
RPR Ser Ql: REACTIVE — AB
RPR Titer: 1:1 {titer}

## 2021-12-09 NOTE — Consult Note (Signed)
Chief Complaint: Patient was seen in consultation today for tubo ovarian abscess drain placement Chief Complaint  Patient presents with   Abdominal Pain   at the request of Dr Katharine LookJ Ozan   Supervising Physician: Marliss CootsSuttle, Dylan  [facility]  History of Present Illness: Kiara PatriciaJasmine [name] is a 21 y.o. female   RLQ pain--- worsening over 2 weeks New dx PID and TOA US today:   MPRESSION: 1. 8.5 x 4.6 x 6.5 cm complex right adnexal mass with cystic and solid elements, internal and surrounding color flow, and multiple fluid pockets at least 1 of which has a fluid-sediment level. This was seen on the CT yesterday and suspected to represent a tubo-ovarian abscess. Alternatively could be a complex hemorrhagic cyst or some sort of a cystic neoplasm. If the patient has a positive pregnancy test, ectopic pregnancy could not be excluded but this is considered less likely given size. 2. The right ovary is believed to be in between the uterus and the right paraovarian/adnexal complex mass. There are small simple follicles. The left ovary is not seen. 3. Normal uterus and endometrium.  Request made for abscess drain placement per OB/GYN Dr Elby ShowersSuttle has reviewed imaging and approves procedure Planned for 11/29 in IR   Past Medical History:  Diagnosis Date   Hidradenitis suppurativa of multiple sites     History reviewed. No pertinent surgical history.  Allergies: Patient has no known allergies.  Medications: Prior to Admission medications   Medication Sig Start Date End Date Taking? Authorizing Provider  cyclobenzaprine (FLEXERIL) 5 MG tablet Take 1 tablet (5 mg total) by mouth 3 (three) times daily as needed for muscle spasms. For cramping. 11/23/21   Ellsworth LennoxJames, David, PA-C  loperamide (IMODIUM) 2 MG capsule Take 1 capsule (2 mg total) by mouth 4 (four) times daily as needed for diarrhea or loose stools. 10/15/21   Valinda HoarWhite, Adrienne R, NP  mebendazole (VERMOX) 100 MG chewable  tablet Take one tablet PO once and repeat once in 2 weeks Patient not taking: Reported on 03/21/2021 02/23/21   Renne CriglerGeiple, Joshua, PA-C  naproxen sodium (ANAPROX DS) 550 MG tablet Take 1 tablet (550 mg total) by mouth 2 (two) times daily with a meal. For pain. 11/23/21   Ellsworth LennoxJames, David, PA-C  OLANZapine zydis (ZYPREXA) 10 MG disintegrating tablet Take 10 mg by mouth at bedtime. Patient not taking: Reported on 03/21/2021 11/24/20   [provider]  ondansetron (ZOFRAN) 4 MG tablet Take 1 tablet (4 mg total) by mouth every 8 (eight) hours as needed for nausea or vomiting. 11/23/21   Ellsworth LennoxJames, David, PA-C     History reviewed. No pertinent family history.  Social History   Socioeconomic History   Marital status: Single    Spouse name: Not on file   Number of children: Not on file   Years of education: Not on file   Highest education level: Not on file  Occupational History   Not on file  Tobacco Use   Smoking status: Every Day    Types: Cigarettes   Smokeless tobacco: Never  Vaping Use   Vaping Use: Some days  Substance and Sexual Activity   Alcohol use: Yes   Drug use: Yes    Types: Marijuana   Sexual activity: Never    Birth control/protection: None  Other Topics Concern   Not on file  Social History Narrative   Not on file   Social Determinants of Health   Financial Resource Strain: Not on file  Food Insecurity: Not on file  Transportation Needs: Not on file  Physical Activity: Not on file  Stress: Not on file  Social Connections: Not on file    Review of Systems: A 12 point ROS discussed and pertinent positives are indicated in the HPI above.  All other systems are negative.  Review of Systems  Constitutional:  Positive for activity change, fatigue and fever.  Respiratory:  Negative for cough and shortness of breath.   Gastrointestinal:  Positive for abdominal pain and nausea.  Psychiatric/Behavioral:  Negative for behavioral problems and confusion.     Vital  Signs: BP 117/64 (BP Location: Right Arm)   Pulse 79   Temp (!) 100.4 F (38 C) (Oral)   Resp 16   Ht 5\' 3"  (1.6 m)   Wt 134 lb 7.7 oz (61 kg)   LMP 11/13/2021 Comment: Negative pregnancy test  SpO2 100%   BMI 23.82 kg/m     Physical Exam Vitals reviewed.  HENT:     Mouth/Throat:     Mouth: Mucous membranes are moist.  Cardiovascular:     Rate and Rhythm: Normal rate and regular rhythm.  Pulmonary:     Effort: Pulmonary effort is normal.     Breath sounds: Normal breath sounds.  Abdominal:     Palpations: Abdomen is soft.     Tenderness: There is abdominal tenderness.  Skin:    General: Skin is warm.  Neurological:     Mental Status: She is alert and oriented to person, place, and time.  Psychiatric:        Behavior: Behavior normal.     Imaging: 13/02/2021 PELVIC COMPLETE WITH TRANSVAGINAL  Result Date: 12/09/2021 CLINICAL DATA:  Tubo-ovarian abscess suspected on CT yesterday. 12/11/2021. EXAM: TRANSABDOMINAL AND TRANSVAGINAL ULTRASOUND OF PELVIS DOPPLER ULTRASOUND OF OVARIES TECHNIQUE: Both transabdominal and transvaginal ultrasound examinations of the pelvis were performed. Transabdominal technique was performed for global imaging of the pelvis including uterus, ovaries, adnexal regions, and pelvic cul-de-sac. It was necessary to proceed with endovaginal exam following the transabdominal exam to visualize the adnexal areas and endometrium better. Color Doppler ultrasound was utilized to evaluate blood flow to the ovaries. COMPARISON:  The CT with IV contrast yesterday at 1:56 a.m. FINDINGS: Uterus Measurements: Tilted to the left, anteverted and slightly retroflexed, measuring 6.3 x 3.2 x 4.4 cm = volume: 45.41 mL. No fibroids or wall mass visualized. The cervix is unremarkable. Endometrium Thickness: 8.5 mm, within normal limits. No focal abnormality visualized. Right ovary Measurements: The right ovary is believed to be in between the uterus and a right paraovarian/adnexal complex  mass. There are small simple follicles. The right ovary appears to measure 2.0 x 1.5 x 1.2 cm = volume: 1.86 mL. Lateral to the right ovary is a complex mass with cystic and solid elements and multiple fluid pockets at least 1 of which has a fluid-sediment level. This was seen on the CT yesterday and suspected to represent a tubo-ovarian abscess. Total measurements are 8.5 x 4.6 x 6.5 cm with patchy color flow within as well as around the mass. Left ovary Not seen. Color doppler evaluation of the right ovary demonstrates normal appearing color flow. Other findings No abnormal free fluid. IMPRESSION: 1. 8.5 x 4.6 x 6.5 cm complex right adnexal mass with cystic and solid elements, internal and surrounding color flow, and multiple fluid pockets at least 1 of which has a fluid-sediment level. This was seen on the CT yesterday and suspected to represent a tubo-ovarian abscess.  Alternatively could be a complex hemorrhagic cyst or some sort of a cystic neoplasm. If the patient has a positive pregnancy test, ectopic pregnancy could not be excluded but this is considered less likely given size. 2. The right ovary is believed to be in between the uterus and the right paraovarian/adnexal complex mass. There are small simple follicles. The left ovary is not seen. 3. Normal uterus and endometrium. Electronically Signed   By: Almira Bar M.D.   On: 12/09/2021 06:54   CT ABDOMEN PELVIS W CONTRAST  Result Date: 12/08/2021 CLINICAL DATA:  Abdominal pain, groin pain and 1 episode of vomiting. EXAM: CT ABDOMEN AND PELVIS WITH CONTRAST TECHNIQUE: Multidetector CT imaging of the abdomen and pelvis was performed using the standard protocol following bolus administration of intravenous contrast. RADIATION DOSE REDUCTION: This exam was performed according to the departmental dose-optimization program which includes automated exposure control, adjustment of the mA and/or kV according to patient size and/or use of iterative  reconstruction technique. CONTRAST:  OMNIPAQUE IOHEXOL 300 MG/ML  SOLN COMPARISON:  None Available. FINDINGS: Lower chest: No acute abnormality. Hepatobiliary: No focal liver abnormality is seen. No gallstones, gallbladder wall thickening, or biliary dilatation. Pancreas: Unremarkable. No pancreatic ductal dilatation or surrounding inflammatory changes. Spleen: Normal in size without focal abnormality. Adrenals/Urinary Tract: Adrenal glands are unremarkable. Kidneys are normal, without renal calculi, focal lesion, or hydronephrosis. Bladder is unremarkable. Stomach/Bowel: Stomach is within normal limits. Appendix appears normal. Dilated loops of air and fluid-filled small bowel are seen within the mid to lower left abdomen (maximum small bowel diameter of approximately 3.0 cm). A transition zone is suspected within the mid left abdomen at a very short segment of small bowel intussusception (axial CT images 39 through 42, CT series 2). Vascular/Lymphatic: No significant vascular findings are present. No enlarged abdominal or pelvic lymph nodes. Reproductive: The uterus and right adnexa are unremarkable. A 5.6 cm x 4.4 cm x 4.4 cm complex, multi-septated area of fluid attenuation is seen within the right adnexa (axial CT images 58 through 68, CT series 2). An adjacent, approximately 1.2 cm diameter fluid-filled, tubular appearing structure is seen along the dorsal aspect of this lesion (axial CT images 53 through 59, CT series 2). Other: No abdominal wall hernia or abnormality. No abdominopelvic ascites. Musculoskeletal: No acute or significant osseous findings. IMPRESSION: 1. Findings concerning for the presence of a right-sided tubo-ovarian abscess with associated right-sided hydrosalpinx. Further evaluation with pelvic ultrasound is recommended. 2. Partial small bowel obstruction involving the mid jejunum, which may be secondary to intermittent episodes of small bowel intussusception. Electronically Signed    By: Aram Candela M.D.   On: 12/08/2021 02:34   DG Abd 2 Views  Result Date: 11/27/2021 CLINICAL DATA:  Acute lower abdominal pain after recent trauma. EXAM: ABDOMEN - 2 VIEW COMPARISON:  None Available. FINDINGS: No abnormal bowel dilatation is noted. Moderate amount of stool seen throughout the colon. There is no evidence of free air. No radio-opaque calculi or other significant radiographic abnormality is seen. IMPRESSION: Moderate stool burden.  No abnormal bowel dilatation. Electronically Signed   By: Lupita Raider M.D.   On: 11/27/2021 09:16    Labs:  CBC: Recent Labs    03/20/21 1408 11/22/21 0739 12/08/21 0011 12/09/21 0340  WBC 5.6 11.7* 12.2* 14.6*  HGB 15.1* 13.5 13.6 12.2  HCT 46.5* 39.8 40.8 36.9  PLT 228 244 393 410*    COAGS: No results for input(s): "INR", "APTT" in the last 8760 hours.  BMP: Recent Labs    02/23/21 1642 03/20/21 1408 11/22/21 0739 12/08/21 0011  NA 138 140  138 138 134*  K 4.2 4.3  3.9 3.5 3.5  CL 105 105  104 102 99  CO2 23 20*  22 24 22   GLUCOSE 80 45*  51* 85 89  BUN 10 16  16  5* 9  CALCIUM 9.6 9.4  9.2 9.0 8.8*  CREATININE 0.66 0.75  0.67 0.85 0.79  GFRNONAA >60 >60  >60 >60 >60    LIVER FUNCTION TESTS: Recent Labs    02/23/21 1642 03/20/21 1408 11/22/21 0739 12/08/21 0011  BILITOT 0.7 1.2 0.5 0.7  AST 21 19 23  39  ALT 16 11 14  33  ALKPHOS 66 55 63 69  PROT 8.1 8.1 7.4 8.5*  ALBUMIN 4.8 4.6 3.9 3.4*    TUMOR MARKERS: No results for input(s): "AFPTM", "CEA", "CA199", "CHROMGRNA" in the last 8760 hours.  Assessment and Plan:  Rt tubo ovarian abscess Scheduled for abscess drain placement in IR 11/29 Orders placed Risks and benefits discussed with the patient including bleeding, infection, damage to adjacent structures including possible ovarian failure, bowel perforation/fistula connection, and sepsis.  All of the patient's questions were answered, patient is agreeable to proceed. Consent signed and  in chart.    Thank you for this interesting consult.  I greatly enjoyed meeting Kiara [name] and look forward to participating in their care.  A copy of this report was sent to the requesting provider on this date.  Electronically Signed: , PA-C 12/09/2021, 12:42 PM   I spent a total of 20 Minutes    in face to face in clinical consultation, greater than 50% of which was counseling/coordinating care for Rt TOA drain placement

## 2021-12-09 NOTE — Progress Notes (Signed)
Subjective: Pt seen.  She is resting quietly in there room.  We have discussed current radiologic findings and treatment plan.  Pt notes adequate pain control and she is tolerating regular diet.  Pt is somewhat overwhelmed  with the current circumstances.  Objective: Vital signs in last 24 hours: Temp:  [98.5 F (36.9 C)-100.4 F (38 C)] 100.4 F (38 C) (11/28 0808) Pulse Rate:  [65-102] 79 (11/28 0808) Resp:  [15-18] 16 (11/28 0900) BP: (104-140)/(63-81) 117/64 (11/28 0808) SpO2:  [97 %-100 %] 100 % (11/28 0808) Weight change:   Intake/Output from previous day: 11/27 0701 - 11/28 0700 In: 1397.4 [P.O.:240; I.V.:833.1; IV Piggyback:324.2] Out: -  Intake/Output this shift: No intake/output data recorded.  General appearance: alert, cooperative, and no distress Head: Normocephalic, without obvious abnormality, atraumatic Resp: clear to auscultation bilaterally Chest wall: no tenderness GI: nondistended, positive bowel sounds, mild RLQ pain, improved from previous Extremities: Homans sign is negative, no sign of DVT  Lab Results: Recent Labs    12/08/21 0011 12/09/21 0340  WBC 12.2* 14.6*  HGB 13.6 12.2  HCT 40.8 36.9  PLT 393 410*   BMET:  Recent Labs    12/08/21 0011  NA 134*  K 3.5  CL 99  CO2 22  GLUCOSE 89  BUN 9  CREATININE 0.79  CALCIUM 8.8*   No results for input(s): "PTH" in the last 72 hours. Iron Studies: No results for input(s): "IRON", "TIBC", "TRANSFERRIN", "FERRITIN" in the last 72 hours. Studies/Results: US PELVIC COMPLETE WITH TRANSVAGINAL  Result Date: 12/09/2021 CLINICAL DATA:  Tubo-ovarian abscess suspected on CT yesterday. [id number]. EXAM: TRANSABDOMINAL AND TRANSVAGINAL ULTRASOUND OF PELVIS DOPPLER ULTRASOUND OF OVARIES TECHNIQUE: Both transabdominal and transvaginal ultrasound examinations of the pelvis were performed. Transabdominal technique was performed for global imaging of the pelvis including uterus, ovaries, adnexal regions, and  pelvic cul-de-sac. It was necessary to proceed with endovaginal exam following the transabdominal exam to visualize the adnexal areas and endometrium better. Color Doppler ultrasound was utilized to evaluate blood flow to the ovaries. COMPARISON:  The CT with IV contrast yesterday at 1:56 a.m. FINDINGS: Uterus Measurements: Tilted to the left, anteverted and slightly retroflexed, measuring 6.3 x 3.2 x 4.4 cm = volume: 45.41 mL. No fibroids or wall mass visualized. The cervix is unremarkable. Endometrium Thickness: 8.5 mm, within normal limits. No focal abnormality visualized. Right ovary Measurements: The right ovary is believed to be in between the uterus and a right paraovarian/adnexal complex mass. There are small simple follicles. The right ovary appears to measure 2.0 x 1.5 x 1.2 cm = volume: 1.86 mL. Lateral to the right ovary is a complex mass with cystic and solid elements and multiple fluid pockets at least 1 of which has a fluid-sediment level. This was seen on the CT yesterday and suspected to represent a tubo-ovarian abscess. Total measurements are 8.5 x 4.6 x 6.5 cm with patchy color flow within as well as around the mass. Left ovary Not seen. Color doppler evaluation of the right ovary demonstrates normal appearing color flow. Other findings No abnormal free fluid. IMPRESSION: 1. 8.5 x 4.6 x 6.5 cm complex right adnexal mass with cystic and solid elements, internal and surrounding color flow, and multiple fluid pockets at least 1 of which has a fluid-sediment level. This was seen on the CT yesterday and suspected to represent a tubo-ovarian abscess. Alternatively could be a complex hemorrhagic cyst or some sort of a cystic neoplasm. If the patient has a positive pregnancy test, ectopic pregnancy  could not be excluded but this is considered less likely given size. 2. The right ovary is believed to be in between the uterus and the right paraovarian/adnexal complex mass. There are small simple follicles.  The left ovary is not seen. 3. Normal uterus and endometrium. Electronically Signed   By: Almira Bar M.D.   On: 12/09/2021 06:54   CT ABDOMEN PELVIS W CONTRAST  Result Date: 12/08/2021 CLINICAL DATA:  Abdominal pain, groin pain and 1 episode of vomiting. EXAM: CT ABDOMEN AND PELVIS WITH CONTRAST TECHNIQUE: Multidetector CT imaging of the abdomen and pelvis was performed using the standard protocol following bolus administration of intravenous contrast. RADIATION DOSE REDUCTION: This exam was performed according to the departmental dose-optimization program which includes automated exposure control, adjustment of the mA and/or kV according to patient size and/or use of iterative reconstruction technique. CONTRAST:  OMNIPAQUE IOHEXOL 300 MG/ML  SOLN COMPARISON:  None Available. FINDINGS: Lower chest: No acute abnormality. Hepatobiliary: No focal liver abnormality is seen. No gallstones, gallbladder wall thickening, or biliary dilatation. Pancreas: Unremarkable. No pancreatic ductal dilatation or surrounding inflammatory changes. Spleen: Normal in size without focal abnormality. Adrenals/Urinary Tract: Adrenal glands are unremarkable. Kidneys are normal, without renal calculi, focal lesion, or hydronephrosis. Bladder is unremarkable. Stomach/Bowel: Stomach is within normal limits. Appendix appears normal. Dilated loops of air and fluid-filled small bowel are seen within the mid to lower left abdomen (maximum small bowel diameter of approximately 3.0 cm). A transition zone is suspected within the mid left abdomen at a very short segment of small bowel intussusception (axial CT images 39 through 42, CT series 2). Vascular/Lymphatic: No significant vascular findings are present. No enlarged abdominal or pelvic lymph nodes. Reproductive: The uterus and right adnexa are unremarkable. A 5.6 cm x 4.4 cm x 4.4 cm complex, multi-septated area of fluid attenuation is seen within the right adnexa (axial CT images  58 through 68, CT series 2). An adjacent, approximately 1.2 cm diameter fluid-filled, tubular appearing structure is seen along the dorsal aspect of this lesion (axial CT images 53 through 59, CT series 2). Other: No abdominal wall hernia or abnormality. No abdominopelvic ascites. Musculoskeletal: No acute or significant osseous findings. IMPRESSION: 1. Findings concerning for the presence of a right-sided tubo-ovarian abscess with associated right-sided hydrosalpinx. Further evaluation with pelvic ultrasound is recommended. 2. Partial small bowel obstruction involving the mid jejunum, which may be secondary to intermittent episodes of small bowel intussusception. Electronically Signed   By: Aram Candela M.D.   On: 12/08/2021 02:34    I have reviewed the patient's current medications.  Assessment/Plan: TOA Pt continues to have relatively normal bowel function.  IR contacted and they believe it is reasonable to attempt CT guided drainage. NPO at midnight with IR drainage on 12/10/21 (presumed) Continue IV antibiotics, follow WBC    LOS: 1 day   Warden Fillers 12/09/2021,11:53 AM

## 2021-12-10 LAB — PROTIME-INR
INR: 1.3 — ABNORMAL HIGH (ref 0.8–1.2)
Prothrombin Time: 15.9 seconds — ABNORMAL HIGH (ref 11.4–15.2)

## 2021-12-10 LAB — CBC
HCT: 35.1 % — ABNORMAL LOW (ref 36.0–46.0)
Hemoglobin: 11.9 g/dL — ABNORMAL LOW (ref 12.0–15.0)
MCH: 29.8 pg (ref 26.0–34.0)
MCHC: 33.9 g/dL (ref 30.0–36.0)
MCV: 87.8 fL (ref 80.0–100.0)
Platelets: 361 10*3/uL (ref 150–400)
RBC: 4 MIL/uL (ref 3.87–5.11)
RDW: 13.8 % (ref 11.5–15.5)
WBC: 15.3 10*3/uL — ABNORMAL HIGH (ref 4.0–10.5)
nRBC: 0 % (ref 0.0–0.2)

## 2021-12-10 LAB — T.PALLIDUM AB, TOTAL: T Pallidum Abs: NONREACTIVE

## 2021-12-10 NOTE — Progress Notes (Signed)
  Images reviewed today by Dr. Fredia Sorrow.  He feels the abscess is very deep, would be too high risk, and would be extremely painful.   He recommends antibiotics and repeat CT scan tomorrow with IV and Oral contrast to re-evaluate.   Ordering providers made aware via secure chat.  Dorthula Bier S Tywaun Hiltner PA-C 12/10/2021 11:53 AM
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
## 2021-12-11 ENCOUNTER — Inpatient Hospital Stay (HOSPITAL_COMMUNITY): Payer: Medicaid Other

## 2021-12-11 MED ORDER — IOHEXOL 9 MG/ML PO SOLN
500.0000 mL | ORAL | Status: AC
  Administered 2021-12-11 (×2): 500 mL via ORAL

## 2021-12-11 MED ORDER — IOHEXOL 350 MG/ML SOLN
75.0000 mL | Freq: Once | INTRAVENOUS | Status: AC | PRN
  Administered 2021-12-11: 75 mL via INTRAVENOUS

## 2021-12-11 MED ORDER — ENOXAPARIN SODIUM 40 MG/0.4ML IJ SOSY
40.0000 mg | PREFILLED_SYRINGE | INTRAMUSCULAR | Status: DC
  Administered 2021-12-11: 40 mg via SUBCUTANEOUS
  Filled 2021-12-11 (×2): qty 0.4

## 2021-12-11 MED ORDER — PIPERACILLIN-TAZOBACTAM 3.375 G IVPB
3.3750 g | Freq: Three times a day (TID) | INTRAVENOUS | Status: DC
  Administered 2021-12-11 – 2021-12-13 (×6): 3.375 g via INTRAVENOUS
  Filled 2021-12-11 (×5): qty 50

## 2021-12-11 NOTE — Progress Notes (Addendum)
Pharmacy Antibiotic Note  Kiara [name] is a 21 y.o. female admitted on 12/07/2021 with TOA unable to be accessed for CT guided drainage .  Pharmacy has been consulted for piperacillin/tazobactam dosing. Patient is also on doxycycline for chlamydia coverage.   CLCr 90 ml/min. WBC 15.3 persistently high.  Plan: Piperacillin/tazobactam 3.375g q8 hr Monitor cultures, clinical status, renal function Narrow abx as able and f/u duration    Height: 5\' 3"  (160 cm) Weight: 61 kg (134 lb 7.7 oz) IBW/kg (Calculated) : 52.4  Temp (24hrs), Avg:98.8 F (37.1 C), Min:98.4 F (36.9 C), Max:99.2 F (37.3 C)  Recent Labs  Lab 12/08/21 0011 12/09/21 0340 12/10/21 0415  WBC 12.2* 14.6* 15.3*  CREATININE 0.79  --   --     Estimated Creatinine Clearance: 92 mL/min (by C-G formula based on SCr of 0.79 mg/dL).    No Known Allergies  Antimicrobials this admission: CTX 11/27 >> 11/29 MTZ 11/27 >> 11/30 doxy 11/27 >>  Zosyn 11/30 >>    Microbiology results: 11/16  pos Neisseira & Garnerella 11/27 influenza A  11/28 RPR + Reactive, titer 1:1 11/28 T pallidum Ab neg  11/28 HIV neg   Thank you for allowing pharmacy to be a part of this patient's care.  12/28, PharmD, BCPS, BCCP Clinical Pharmacist  Please check AMION for all [facility] Pharmacy phone numbers After 10:00 PM, call Main Pharmacy [phone]

## 2021-12-11 NOTE — Progress Notes (Signed)
New CT images reviewed today by Dr. Fredia Sorrow.  The abscess is slightly smaller.  However a safe approach for percutaneous drainage is not currently identified.  No IR intervention recommended at this time.  Care team made aware via secure chat.  Electronically Signed: Sheliah Plane, PA-C 12/11/2021, 2:03 PM

## 2021-12-11 NOTE — Progress Notes (Signed)
Gynecology Progress Note  Admission Date: 12/07/2021 Current Date: 12/11/2021 2:15 PM  Kiara [name] is a 21 y.o. No obstetric history on file. HD#4 admitted for pelvic pain and tubo-ovarian abscess.   History complicated by: Patient Active Problem List   Diagnosis Date Noted   Tubo-ovarian abscess 12/08/2021   Homelessness 06/26/2020   Brief psychotic disorder (HCC)    Unspecified mood (affective) disorder (HCC) 04/10/2020    ROS and patient/family/surgical history, located on admission H&P note dated 12/07/2021, have been reviewed, and there are no changes except as noted below Yesterday/Overnight Events:  none  Subjective:  Pt seen.  She notes slightly more pain and nausea today.  At time of evaluation, she was awaiting CT scan.  She felt slightly cooled, but denies fever.  Objective:   Vitals:   12/10/21 1626 12/10/21 2119 12/11/21 0526 12/11/21 0622  BP: (!) 123/58 135/79 123/74 107/65  Pulse: 65 61 72 78  Resp: 16 17 18 18   Temp: 98.7 F (37.1 C) 98.4 F (36.9 C) 99.2 F (37.3 C) 98.9 F (37.2 C)  TempSrc: Oral Oral Oral Oral  SpO2: 100% 100% 100% 100%  Weight:      Height:        Temp:  [98.4 F (36.9 C)-99.2 F (37.3 C)] 98.9 F (37.2 C) (11/30 0622) Pulse Rate:  [61-78] 78 (11/30 0622) Resp:  [16-18] 18 (11/30 0622) BP: (107-135)/(58-79) 107/65 (11/30 0622) SpO2:  [100 %] 100 % (11/30 0622) I/O last 3 completed shifts: In: 1822.3 [P.O.:360; I.V.:1462.3] Out: -  No intake/output data recorded.  Intake/Output Summary (Last 24 hours) at 12/11/2021 1415 Last data filed at 12/11/2021 1225 Gross per 24 hour  Intake 1822.26 ml  Output --  Net 1822.26 ml     Current Vital Signs 24h Vital Sign Ranges  T 98.9 F (37.2 C) Temp  Avg: 98.8 F (37.1 C)  Min: 98.4 F (36.9 C)  Max: 99.2 F (37.3 C)  BP 107/65 BP  Min: 107/65  Max: 135/79  HR 78 Pulse  Avg: 69  Min: 61  Max: 78  RR 18 Resp  Avg: 17.3  Min: 16  Max: 18  SaO2 100 % Room Air SpO2  Avg:  100 %  Min: 100 %  Max: 100 %       24 Hour I/O Current Shift I/O  Time Ins Outs 11/29 0701 - 11/30 0700 In: 1822.3 [P.O.:360; I.V.:1462.3] Out: -  No intake/output data recorded.   Patient Vitals for the past 12 hrs:  BP Temp Temp src Pulse Resp SpO2  12/11/21 0622 107/65 98.9 F (37.2 C) Oral 78 18 100 %  12/11/21 0526 123/74 99.2 F (37.3 C) Oral 72 18 100 %     Patient Vitals for the past 24 hrs:  BP Temp Temp src Pulse Resp SpO2  12/11/21 0622 107/65 98.9 F (37.2 C) Oral 78 18 100 %  12/11/21 0526 123/74 99.2 F (37.3 C) Oral 72 18 100 %  12/10/21 2119 135/79 98.4 F (36.9 C) Oral 61 17 100 %  12/10/21 1626 (!) 123/58 98.7 F (37.1 C) Oral 65 16 100 %    Physical exam: General appearance: alert, cooperative, appears stated age, and no distress Abdomen:  nondistended, soft, positive bowel sounds,mild RLQ tenderness with palpation GU: No gross VB Lungs: clear to auscultation bilaterally Heart: regular rate and rhythm Extremities: no lower extremity edema Skin: WNL Psych: appropriate Neurologic: Grossly normal  Medications Current Facility-Administered Medications  Medication Dose Route Frequency Provider  Last Rate Last Admin   0.9 %  sodium chloride infusion   Intravenous Continuous Myna Hidalgo, DO 125 mL/hr at 12/11/21 0308 New Bag at 12/11/21 0308   doxycycline (VIBRAMYCIN) 100 mg in sodium chloride 0.9 % 250 mL IVPB  100 mg Intravenous Q12H Prosperi, Christian H, PA-C 125 mL/hr at 12/11/21 0437 100 mg at 12/11/21 0437   HYDROmorphone (DILAUDID) injection 1 mg  1 mg Intravenous Q4H PRN Myna Hidalgo, DO   1 mg at 12/11/21 0015   metroNIDAZOLE (FLAGYL) IVPB 500 mg  500 mg Intravenous Q12H Prosperi, Christian H, PA-C 100 mL/hr at 12/11/21 0328 500 mg at 12/11/21 0328   ondansetron (ZOFRAN) injection 4 mg  4 mg Intravenous Q6H PRN Prosperi, Christian H, PA-C   4 mg at 12/11/21 0725   ondansetron (ZOFRAN) injection 4 mg  4 mg Intravenous Once Melene Plan, DO        oseltamivir (TAMIFLU) capsule 75 mg  75 mg Oral BID Myna Hidalgo, DO   75 mg at 12/11/21 0914      Labs  Recent Labs  Lab 12/08/21 0011 12/09/21 0340 12/10/21 0415  WBC 12.2* 14.6* 15.3*  HGB 13.6 12.2 11.9*  HCT 40.8 36.9 35.1*  PLT 393 410* 361    Recent Labs  Lab 12/08/21 0011  NA 134*  K 3.5  CL 99  CO2 22  BUN 9  CREATININE 0.79  CALCIUM 8.8*  PROT 8.5*  BILITOT 0.7  ALKPHOS 69  ALT 33  AST 39  GLUCOSE 89    Radiology CLINICAL DATA:  Pelvic inflammatory disease with multiloculated right-sided tubo-ovarian abscess. Assessment TOA on current anti microbial treatment to determine feasibility potential percutaneous catheter drainage.   EXAM: CT ABDOMEN AND PELVIS WITH CONTRAST   TECHNIQUE: Multidetector CT imaging of the abdomen and pelvis was performed using the standard protocol following bolus administration of intravenous contrast.   RADIATION DOSE REDUCTION: This exam was performed according to the departmental dose-optimization program which includes automated exposure control, adjustment of the mA and/or kV according to patient size and/or use of iterative reconstruction technique.   CONTRAST:  60mL OMNIPAQUE IOHEXOL 350 MG/ML SOLN   COMPARISON:  12/08/2021   FINDINGS: Lower chest: No acute abnormality.   Hepatobiliary: No focal liver abnormality is seen. No gallstones, gallbladder wall thickening, or biliary dilatation.   Pancreas: Unremarkable. No pancreatic ductal dilatation or surrounding inflammatory changes.   Spleen: Normal in size without focal abnormality.   Adrenals/Urinary Tract: Adrenal glands are unremarkable. Kidneys are normal, without renal calculi, focal lesion, or hydronephrosis. Bladder is unremarkable.   Stomach/Bowel: Bowel shows no evidence of obstruction, ileus, inflammation or lesion. The appendix is normal. No free intraperitoneal air.   Vascular/Lymphatic: Normal aorta. No enlarged abdominal or  pelvic lymph nodes.   Reproductive: The multiloculated and thick-walled, rim enhancing ill-defined collection in the right pelvis/adnexal region consistent with TOA shows slight decrease in size since the prior CT now measuring approximately 5.8 x 3.9 x 4.9 cm compared to approximately 5.9 x 4.4 x 5.9 cm in the exact same dimensions on the prior study. Liquefied pockets are clearly smaller and the overall region is also slightly deeper in the pelvis relative to the posterior gluteal skin compared to the prior study. Approach to the collection from a right posterior transgluteal approach remains quite difficult and tenuous due to numerous overlying arteries and veins in the posterior internal iliac distribution as well as the far right lateral wall of the rectum. No new pelvic fluid  collections identified. Uterus and left adnexal region unremarkable.   Other: No abdominal wall hernia or abnormality. No abdominopelvic ascites.   Musculoskeletal: No acute or significant osseous findings.   IMPRESSION: Slight decrease in size of the multiloculated and thick-walled, rim enhancing ill-defined collection in the right pelvis/adnexal region consistent with TOA shows slight decrease in size since the prior CT. Liquefied pockets are clearly smaller and the overall region also slightly deeper in the pelvis relative to the posterior gluteal skin compared to the prior study. Approach to the collection from a right posterior transgluteal approach remains quite difficult and tenuous due to numerous overlying arteries and veins in the posterior internal iliac distribution as well as the far right lateral wall of the rectum. Safe approach for percutaneous catheter drainage is currently not identified.     Electronically Signed   By: Irish Lack M.D.   On: 12/11/2021 13:56  Assessment & Plan:  Right TOA *GYN:  TOA unable to be accessed for CT guided drainage, will discontinue rocephin and  convert to zosyn, continue serial CBC and monitor for symptomatic improvement *Pain: currently well controlled *FEN/GI: No issues oral nutrition currently  Code Status: Prior  Total time taking care of the patient was 20 minutes, with greater than 50% of the time spent in face to face interaction with the patient. Mariel Aloe,  MD Attending Center for [facility])

## 2021-12-12 ENCOUNTER — Other Ambulatory Visit (HOSPITAL_COMMUNITY): Payer: Self-pay

## 2021-12-12 LAB — CBC
HCT: 34.5 % — ABNORMAL LOW (ref 36.0–46.0)
Hemoglobin: 11.3 g/dL — ABNORMAL LOW (ref 12.0–15.0)
MCH: 28.6 pg (ref 26.0–34.0)
MCHC: 32.8 g/dL (ref 30.0–36.0)
MCV: 87.3 fL (ref 80.0–100.0)
Platelets: 398 10*3/uL (ref 150–400)
RBC: 3.95 MIL/uL (ref 3.87–5.11)
RDW: 13.9 % (ref 11.5–15.5)
WBC: 8.1 10*3/uL (ref 4.0–10.5)
nRBC: 0 % (ref 0.0–0.2)

## 2021-12-12 MED ORDER — IBUPROFEN 600 MG PO TABS
600.0000 mg | ORAL_TABLET | Freq: Four times a day (QID) | ORAL | 2 refills | Status: AC | PRN
  Filled 2021-12-12: qty 30, 8d supply, fill #0

## 2021-12-12 MED ORDER — OXYCODONE HCL 5 MG PO TABS
5.0000 mg | ORAL_TABLET | Freq: Four times a day (QID) | ORAL | 0 refills | Status: AC | PRN
  Filled 2021-12-12: qty 6, 2d supply, fill #0

## 2021-12-12 MED ORDER — DOXYCYCLINE HYCLATE 100 MG PO TABS
100.0000 mg | ORAL_TABLET | Freq: Two times a day (BID) | ORAL | Status: DC
  Administered 2021-12-12 – 2021-12-13 (×2): 100 mg via ORAL
  Filled 2021-12-12 (×2): qty 1

## 2021-12-12 MED ORDER — ORAL CARE MOUTH RINSE: 15.0000 mL | OROMUCOSAL | Status: DC | PRN

## 2021-12-12 MED ORDER — AMOXICILLIN-POT CLAVULANATE 875-125 MG PO TABS
1.0000 | ORAL_TABLET | Freq: Two times a day (BID) | ORAL | 0 refills | Status: AC
  Filled 2021-12-12: qty 28, 14d supply, fill #0

## 2021-12-12 MED ORDER — DOXYCYCLINE HYCLATE 100 MG PO TABS
100.0000 mg | ORAL_TABLET | Freq: Two times a day (BID) | ORAL | 0 refills | Status: AC
  Filled 2021-12-12: qty 28, 14d supply, fill #0

## 2021-12-12 NOTE — TOC Progression Note (Signed)
Discharge medications (4) are being stored in the main pharmacy on the ground floor until patient is ready for discharge.   

## 2021-12-12 NOTE — Progress Notes (Signed)
Gynecology Progress Note  Admission Date: 12/07/2021 Current Date: 12/12/2021 3:20 PM  Kiara [name] is a 21 y.o. No obstetric history on file. HD#5 admitted for right sided TOA.   History complicated by: Patient Active Problem List   Diagnosis Date Noted   Tubo-ovarian abscess 12/08/2021   Homelessness 06/26/2020   Brief psychotic disorder ([place])    Unspecified mood (affective) disorder ([place]) 04/10/2020    ROS and patient/family/surgical history, located on admission H&P note dated 12/07/2021, have been reviewed, and there are no changes except as noted below Yesterday/Overnight Events:  none  Subjective:  Interventional radiology noted the TOA had gotten slightly smaller and again declined CT guided drainage.  At that time Rocephin was discontinued and zosyn was started.Today pt feels better and her WBC is now normal.  Discussed anticipated d/c home on 12/13/21.  She continues to tolerate regular diet and nausea is minimal.  Objective:   Vitals:   12/11/21 1751 12/11/21 1923 12/12/21 0703 12/12/21 0902  BP: (!) 146/71 123/82 102/62 103/67  Pulse: 72 61 (!) 56 (!) 58  Resp: 18 15 18 17   Temp: 98.2 F (36.8 C) 98 F (36.7 C) 98.7 F (37.1 C) 98.1 F (36.7 C)  TempSrc: Oral Oral Oral Oral  SpO2: 100% 100% 100% 99%  Weight:      Height:        Temp:  [98 F (36.7 C)-98.7 F (37.1 C)] 98.1 F (36.7 C) (12/01 0902) Pulse Rate:  [56-72] 58 (12/01 0902) Resp:  [15-18] 17 (12/01 0902) BP: (102-146)/(62-82) 103/67 (12/01 0902) SpO2:  [99 %-100 %] 99 % (12/01 0902) I/O last 3 completed shifts: In: 120 [P.O.:120] Out: -  No intake/output data recorded.  Intake/Output Summary (Last 24 hours) at 12/12/2021 1520 Last data filed at 12/11/2021 1837 Gross per 24 hour  Intake 120 ml  Output --  Net 120 ml     Current Vital Signs 24h Vital Sign Ranges  T 98.1 F (36.7 C) Temp  Avg: 98.3 F (36.8 C)  Min: 98 F (36.7 C)  Max: 98.7 F (37.1 C)  BP 103/67 BP  Min: 102/62   Max: 146/71  HR (!) 58 Pulse  Avg: 61.8  Min: 56  Max: 72  RR 17 Resp  Avg: 17  Min: 15  Max: 18  SaO2 99 % Room Air SpO2  Avg: 99.8 %  Min: 99 %  Max: 100 %       24 Hour I/O Current Shift I/O  Time Ins Outs 11/30 0701 - 12/01 0700 In: 120 [P.O.:120] Out: -  No intake/output data recorded.   Patient Vitals for the past 12 hrs:  BP Temp Temp src Pulse Resp SpO2  12/12/21 0902 103/67 98.1 F (36.7 C) Oral (!) 58 17 99 %  12/12/21 0703 102/62 98.7 F (37.1 C) Oral (!) 56 18 100 %     Patient Vitals for the past 24 hrs:  BP Temp Temp src Pulse Resp SpO2  12/12/21 0902 103/67 98.1 F (36.7 C) Oral (!) 58 17 99 %  12/12/21 0703 102/62 98.7 F (37.1 C) Oral (!) 56 18 100 %  12/11/21 1923 123/82 98 F (36.7 C) Oral 61 15 100 %  12/11/21 1751 (!) 146/71 98.2 F (36.8 C) Oral 72 18 100 %    Physical exam: General appearance: alert, cooperative, appears stated age, and no distress Abdomen:  soft , mild RLQ tenderness, no distention, positive bowel sounds GU: No gross VB Lungs: clear to auscultation  bilaterally Heart: regular rate and rhythm Extremities: no lower extremity edema Skin: WNL Psych: appropriate Neurologic: Grossly normal  Medications Current Facility-Administered Medications  Medication Dose Route Frequency Provider Last Rate Last Admin   0.9 %  sodium chloride infusion   Intravenous Continuous Myna Hidalgo, DO 125 mL/hr at 12/12/21 1508 New Bag at 12/12/21 1508   doxycycline (VIBRA-TABS) tablet 100 mg  100 mg Oral Q12H Warden Fillers, MD       enoxaparin (LOVENOX) injection 40 mg  40 mg Subcutaneous Q24H Warden Fillers, MD   40 mg at 12/11/21 1531   HYDROmorphone (DILAUDID) injection 1 mg  1 mg Intravenous Q4H PRN Myna Hidalgo, DO   1 mg at 12/12/21 0825   ondansetron (ZOFRAN) injection 4 mg  4 mg Intravenous Q6H PRN Prosperi, Christian H, PA-C   4 mg at 12/12/21 0825   ondansetron (ZOFRAN) injection 4 mg  4 mg Intravenous Once Melene Plan, DO       Oral  care mouth rinse  15 mL Mouth Rinse PRN Lazaro Arms, MD       oseltamivir (TAMIFLU) capsule 75 mg  75 mg Oral BID Myna Hidalgo, DO   75 mg at 12/12/21 0934   piperacillin-tazobactam (ZOSYN) IVPB 3.375 g  3.375 g Intravenous Q8H Leander Rams, RPH 12.5 mL/hr at 12/12/21 1507 3.375 g at 12/12/21 1507      Labs  Recent Labs  Lab 12/09/21 0340 12/10/21 0415 12/12/21 1023  WBC 14.6* 15.3* 8.1  HGB 12.2 11.9* 11.3*  HCT 36.9 35.1* 34.5*  PLT 410* 361 398    Recent Labs  Lab 12/08/21 0011  NA 134*  K 3.5  CL 99  CO2 22  BUN 9  CREATININE 0.79  CALCIUM 8.8*  PROT 8.5*  BILITOT 0.7  ALKPHOS 69  ALT 33  AST 39  GLUCOSE 89    Radiology CT ABDOMEN AND PELVIS WITH CONTRAST   TECHNIQUE: Multidetector CT imaging of the abdomen and pelvis was performed using the standard protocol following bolus administration of intravenous contrast.   RADIATION DOSE REDUCTION: This exam was performed according to the departmental dose-optimization program which includes automated exposure control, adjustment of the mA and/or kV according to patient size and/or use of iterative reconstruction technique.   CONTRAST:  30mL OMNIPAQUE IOHEXOL 350 MG/ML SOLN   COMPARISON:  12/08/2021   FINDINGS: Lower chest: No acute abnormality.   Hepatobiliary: No focal liver abnormality is seen. No gallstones, gallbladder wall thickening, or biliary dilatation.   Pancreas: Unremarkable. No pancreatic ductal dilatation or surrounding inflammatory changes.   Spleen: Normal in size without focal abnormality.   Adrenals/Urinary Tract: Adrenal glands are unremarkable. Kidneys are normal, without renal calculi, focal lesion, or hydronephrosis. Bladder is unremarkable.   Stomach/Bowel: Bowel shows no evidence of obstruction, ileus, inflammation or lesion. The appendix is normal. No free intraperitoneal air.   Vascular/Lymphatic: Normal aorta. No enlarged abdominal or pelvic lymph nodes.    Reproductive: The multiloculated and thick-walled, rim enhancing ill-defined collection in the right pelvis/adnexal region consistent with TOA shows slight decrease in size since the prior CT now measuring approximately 5.8 x 3.9 x 4.9 cm compared to approximately 5.9 x 4.4 x 5.9 cm in the exact same dimensions on the prior study. Liquefied pockets are clearly smaller and the overall region is also slightly deeper in the pelvis relative to the posterior gluteal skin compared to the prior study. Approach to the collection from a right posterior transgluteal approach remains quite  difficult and tenuous due to numerous overlying arteries and veins in the posterior internal iliac distribution as well as the far right lateral wall of the rectum. No new pelvic fluid collections identified. Uterus and left adnexal region unremarkable.   Other: No abdominal wall hernia or abnormality. No abdominopelvic ascites.   Musculoskeletal: No acute or significant osseous findings.   IMPRESSION: Slight decrease in size of the multiloculated and thick-walled, rim enhancing ill-defined collection in the right pelvis/adnexal region consistent with TOA shows slight decrease in size since the prior CT. Liquefied pockets are clearly smaller and the overall region also slightly deeper in the pelvis relative to the posterior gluteal skin compared to the prior study. Approach to the collection from a right posterior transgluteal approach remains quite difficult and tenuous due to numerous overlying arteries and veins in the posterior internal iliac distribution as well as the far right lateral wall of the rectum. Safe approach for percutaneous catheter drainage is currently not identified.    Assessment & Plan:  Tubo-ovarian abscess *GYN: continue zosyn, convert to po doxycycline.  Will discharge home on augmentin and doxycycline x 14 days then have follow up in 4-6 weeks.  Meds to beds contacted *Pain:  currently well controlled *FEN/GI: regular diet *Dispo: anticipate d/c in AM  Code Status: Prior   Lynnda Shields, MD Attending Center for [place] [facility])

## 2021-12-13 NOTE — Progress Notes (Signed)
Orthopedic Tech Progress Note Patient Details:  Kiara [name] [birth year] [id number]  Ortho Devices Type of Ortho Device: Abdominal binder Ortho Device/Splint Interventions: Ordered, Application   Post Interventions Patient Tolerated: Well  Genelle Bal Lesia Monica 12/13/2021, 8:23 AM

## 2021-12-13 NOTE — TOC Transition Note (Signed)
Transition of Care [facility]) - CM/SW Discharge Note   Patient Details  Name: Kiara [name] MRN: [id number] Date of Birth: 2000/04/07  Transition of Care [facility]) CM/SW Contact:  Bess Kinds, RN Phone Number: [phone] 12/13/2021, 8:44 AM   Clinical Narrative:     Acknowledging [facility] consult from MD that patient needing taxi voucher d/t current medical needs. Spoke with patient at the bedside - patient to be transported by [place] taxi to her sister's home at  [address], [place]. Surveyor, quantity signed. Nursing aware. No further TOC needs identified at this time.    Final next level of care: Home/Self Care Barriers to Discharge: No Barriers Identified   Patient Goals and CMS Choice Patient states their goals for this hospitalization and ongoing recovery are:: patient stated she will go to her sister's to get her car CMS Medicare.gov Compare Post Acute Care list provided to:: Patient Choice offered to / list presented to : NA  Discharge Placement                       Discharge Plan and Services                DME Arranged: N/A DME Agency: NA       HH Arranged: NA HH Agency: NA        Social Determinants of Health (SDOH) Interventions Transportation Interventions: Inpatient TOC, Taxi Voucher Given   Readmission Risk Interventions     No data to display

## 2021-12-13 NOTE — Discharge Summary (Signed)
Gynecology Physician Postoperative Discharge Summary  Patient ID: Kiara [name] MRN: [id number] DOB/AGE: 2000-05-06 21 y.o.  Admit Date: 12/07/2021 Discharge Date: 12/13/2021  Admission Diagnoses:  Tubo-ovarian abscess [N70.93]  Discharge Diagnoses:  Same  Procedures: None  Hospital Course:  Waynetta Metheny is a 21 y.o. G0 who was admitted for [facility] and PID. She had CT imaging showing a 6 cm TOA in s/o CT/GC and Trich. She was started on IV ABX. She was initially on Ceftriaxone, Doxy and Flagyl but was switched to Zosyn and Doxy with greater improvement. She was also diagnosed with the flu while in the hospital and started on Tamiflu. IR was consulted but the patient was not a candidate for drainage via IR due to vasculature in the proximity of the abscess. Her serial CT showed some improvement from initial imaging.   By time of discharge, her pain was controlled on oral pain medications; she was ambulating, voiding without difficulty, tolerating regular diet. She was deemed stable for discharge to home. She doesn't have a ride so TOC was consulted and ride will be arranged. Meds to bed done.   Significant Labs:    Latest Ref Rng & Units 12/12/2021   10:23 AM 12/10/2021    4:15 AM 12/09/2021    3:40 AM  CBC  WBC 4.0 - 10.5 K/uL 8.1  15.3  14.6   Hemoglobin 12.0 - 15.0 g/dL 92.4  26.8  34.1   Hematocrit 36.0 - 46.0 % 34.5  35.1  36.9   Platelets 150 - 400 K/uL 398  361  410     Discharge Exam: Blood pressure 119/81, pulse (!) 58, temperature 98.5 F (36.9 C), temperature source Oral, resp. rate 16, height 5\' 3"  (1.6 m), weight 61 kg, last menstrual period 11/13/2021, SpO2 100 %. General appearance: alert and no distress  Resp: clear to auscultation bilaterally  Cardio: regular rate and rhythm  GI: soft, non-tender; bowel sounds normal; no masses, no organomegaly.  Incision: C/D/I, no erythema, no drainage noted Pelvic: scant blood on pad (done in presence of RN as chaperone)   Extremities: extremities normal, atraumatic, no cyanosis or edema and Homans sign is negative, no sign of DVT  Discharged Condition: Stable  Disposition: Discharge disposition: 01-Home or Self Care       Discharge Instructions     Activity as tolerated - No restrictions   Complete by: As directed    Call MD for:  difficulty breathing, headache or visual disturbances   Complete by: As directed    Call MD for:  persistant nausea and vomiting   Complete by: As directed    Call MD for:  redness, tenderness, or signs of infection (pain, swelling, redness, odor or green/yellow discharge around incision site)   Complete by: As directed    Call MD for:  severe uncontrolled pain   Complete by: As directed    Call MD for:  temperature >100.4   Complete by: As directed    Diet - low sodium heart healthy   Complete by: As directed    Driving Restrictions   Complete by: As directed    When not taking narcotic pain medication and would not hesitate to use the breaks, usually about 7 days   May shower / Bathe   Complete by: As directed    No wound care   Complete by: As directed       Allergies as of 12/13/2021   No Known Allergies      Medication List  STOP taking these medications    cyclobenzaprine 5 MG tablet Commonly known as: FLEXERIL   GaviLAX 17 GM/SCOOP powder Generic drug: polyethylene glycol powder   loperamide 2 MG capsule Commonly known as: IMODIUM   naproxen sodium 550 MG tablet Commonly known as: Anaprox DS   OLANZapine zydis 10 MG disintegrating tablet Commonly known as: ZYPREXA   ondansetron 4 MG tablet Commonly known as: Zofran   psyllium 58.6 % packet Commonly known as: METAMUCIL       TAKE these medications    acetaminophen 500 MG tablet Commonly known as: TYLENOL Take 1,000 mg by mouth every 6 (six) hours as needed for mild pain.   amoxicillin-clavulanate 875-125 MG tablet Commonly known as: AUGMENTIN Take 1 tablet by mouth 2  (two) times daily for 14 days.   ascorbic acid 500 MG tablet Commonly known as: VITAMIN C Take 500 mg by mouth daily.   doxycycline 100 MG tablet Commonly known as: VIBRA-TABS Take 1 tablet (100 mg total) by mouth 2 (two) times daily for 14 days.   Elderberry 500 MG Caps Take 1,000 mg by mouth daily.   ibuprofen 600 MG tablet Commonly known as: ADVIL Take 1 tablet (600 mg total) by mouth every 6 (six) hours as needed for headache, mild pain, moderate pain or cramping.   oxyCODONE 5 MG immediate release tablet Commonly known as: Oxy IR/ROXICODONE Take 1 tablet (5 mg total) by mouth every 6 (six) hours as needed for severe pain or breakthrough pain.   Zinc 50 MG Tabs Take 50 mg by mouth daily.       No future appointments.  Follow-up Information     Center for [facility] Follow up.   Specialty: Obstetrics and Gynecology Contact information: [address] [place] [address] [phone]                Total discharge time: 30 minutes   Signed:  Milas Hock, MD, FACOG Attending Obstetrician & Gynecologist Faculty Practice, [facility]

## 2021-12-13 NOTE — Plan of Care (Signed)

## 2021-12-13 NOTE — Progress Notes (Signed)
Dawayne Patricia to be D/C'd  per MD order.  Discussed with the patient and all questions fully answered. Patient asking this nurse if its ok for her to drink alcohol now. This nurse educated patient that she is an antibiotic and pain medication and should refrain in drinking alcohol. Patient just laughed at this writer and says Alcohol helps and will make me feel better.  VSS, Skin clean, dry and intact without evidence of skin break down, no evidence of skin tears noted.  IV catheter discontinued intact. Site without signs and symptoms of complications. Dressing and pressure applied.  An After Visit Summary was printed and given to the patient. Patient received prescription.  D/c education completed with patient/family including follow up instructions, medication list, d/c activities limitations if indicated, with other d/c instructions as indicated by MD - patient able to verbalize understanding, all questions fully answered.   Patient instructed to return to ED, call 911, or call MD for any changes in condition.   Patient ambulatory upon discharge, staff assisted patient with transport , [employer] called.

## 2022-01-27 ENCOUNTER — Emergency Department (HOSPITAL_COMMUNITY)
Admission: EM | Admit: 2022-01-27 | Discharge: 2022-01-28 | Disposition: A | Discharge status: Other Acute Inpatient Hospital | Payer: Medicaid Other | Attending: Emergency Medicine | Admitting: Emergency Medicine

## 2022-01-27 ENCOUNTER — Other Ambulatory Visit: Payer: Self-pay

## 2022-01-27 DIAGNOSIS — Z1152 Encounter for screening for COVID-19: Secondary | ICD-10-CM | POA: Insufficient documentation

## 2022-01-27 DIAGNOSIS — F1290 Cannabis use, unspecified, uncomplicated: Secondary | ICD-10-CM | POA: Diagnosis not present

## 2022-01-27 DIAGNOSIS — F17210 Nicotine dependence, cigarettes, uncomplicated: Secondary | ICD-10-CM | POA: Diagnosis not present

## 2022-01-27 DIAGNOSIS — F23 Brief psychotic disorder: Secondary | ICD-10-CM | POA: Insufficient documentation

## 2022-01-27 DIAGNOSIS — F39 Unspecified mood [affective] disorder: Secondary | ICD-10-CM | POA: Diagnosis present

## 2022-01-27 DIAGNOSIS — R451 Restlessness and agitation: Secondary | ICD-10-CM | POA: Diagnosis present

## 2022-01-27 LAB — URINALYSIS, ROUTINE W REFLEX MICROSCOPIC
Bacteria, UA: NONE SEEN
Glucose, UA: NEGATIVE mg/dL
Hgb urine dipstick: NEGATIVE
Ketones, ur: 20 mg/dL — AB
Leukocytes,Ua: NEGATIVE
Nitrite: NEGATIVE
Protein, ur: 100 mg/dL — AB
Specific Gravity, Urine: 1.036 — ABNORMAL HIGH (ref 1.005–1.030)
pH: 5 (ref 5.0–8.0)

## 2022-01-27 LAB — COMPREHENSIVE METABOLIC PANEL
ALT: 19 U/L (ref 0–44)
AST: 32 U/L (ref 15–41)
Albumin: 4.3 g/dL (ref 3.5–5.0)
Alkaline Phosphatase: 58 U/L (ref 38–126)
Anion gap: 11 (ref 5–15)
BUN: 12 mg/dL (ref 6–20)
CO2: 23 mmol/L (ref 22–32)
Calcium: 9.1 mg/dL (ref 8.9–10.3)
Chloride: 103 mmol/L (ref 98–111)
Creatinine, Ser: 0.73 mg/dL (ref 0.44–1.00)
GFR, Estimated: >60 mL/min (ref >60)
Glucose, Bld: 92 mg/dL (ref 70–99)
Potassium: 3.3 mmol/L — ABNORMAL LOW (ref 3.5–5.1)
Sodium: 137 mmol/L (ref 135–145)
Total Bilirubin: 0.9 mg/dL (ref 0.3–1.2)
Total Protein: 7.7 g/dL (ref 6.5–8.1)

## 2022-01-27 LAB — CBC WITH DIFFERENTIAL/PLATELET
Abs Immature Granulocytes: 0.03 10*3/uL (ref 0.00–0.07)
Basophils Absolute: 0 10*3/uL (ref 0.0–0.1)
Basophils Relative: 0 %
Eosinophils Absolute: 0.1 10*3/uL (ref 0.0–0.5)
Eosinophils Relative: 1 %
HCT: 38.1 % (ref 36.0–46.0)
Hemoglobin: 12.5 g/dL (ref 12.0–15.0)
Immature Granulocytes: 0 %
Lymphocytes Relative: 20 %
Lymphs Abs: 1.9 10*3/uL (ref 0.7–4.0)
MCH: 28.8 pg (ref 26.0–34.0)
MCHC: 32.8 g/dL (ref 30.0–36.0)
MCV: 87.8 fL (ref 80.0–100.0)
Monocytes Absolute: 1 10*3/uL (ref 0.1–1.0)
Monocytes Relative: 10 %
Neutro Abs: 6.6 10*3/uL (ref 1.7–7.7)
Neutrophils Relative %: 69 %
Platelets: 207 10*3/uL (ref 150–400)
RBC: 4.34 MIL/uL (ref 3.87–5.11)
RDW: 15.5 % (ref 11.5–15.5)
WBC: 9.6 10*3/uL (ref 4.0–10.5)
nRBC: 0 % (ref 0.0–0.2)

## 2022-01-27 LAB — RAPID URINE DRUG SCREEN, HOSP PERFORMED
Amphetamines: NOT DETECTED
Barbiturates: NOT DETECTED
Benzodiazepines: NOT DETECTED
Cocaine: NOT DETECTED
Opiates: NOT DETECTED
Tetrahydrocannabinol: POSITIVE — AB

## 2022-01-27 LAB — RESP PANEL BY RT-PCR (RSV, FLU A&B, COVID)  RVPGX2
Influenza A by PCR: NEGATIVE
Influenza B by PCR: NEGATIVE
Resp Syncytial Virus by PCR: NEGATIVE
SARS Coronavirus 2 by RT PCR: NEGATIVE

## 2022-01-27 LAB — ETHANOL: Alcohol, Ethyl (B): <10 mg/dL (ref <10)

## 2022-01-27 LAB — SALICYLATE LEVEL: Salicylate Lvl: <7 mg/dL — ABNORMAL LOW (ref 7.0–30.0)

## 2022-01-27 LAB — I-STAT BETA HCG BLOOD, ED (MC, WL, AP ONLY): I-stat hCG, quantitative: <5 m[IU]/mL (ref <5)

## 2022-01-27 LAB — ACETAMINOPHEN LEVEL: Acetaminophen (Tylenol), Serum: <10 ug/mL — ABNORMAL LOW (ref 10–30)

## 2022-01-27 MED ORDER — STERILE WATER FOR INJECTION IJ SOLN
INTRAMUSCULAR | Status: AC
  Administered 2022-01-27: 10 mL
  Filled 2022-01-27: qty 10

## 2022-01-27 MED ORDER — DIVALPROEX SODIUM ER 250 MG PO TB24
250.0000 mg | ORAL_TABLET | Freq: Every day | ORAL | Status: DC
  Administered 2022-01-27 – 2022-01-28 (×2): 250 mg via ORAL
  Filled 2022-01-27 (×2): qty 1

## 2022-01-27 MED ORDER — OLANZAPINE 5 MG PO TBDP
5.0000 mg | ORAL_TABLET | Freq: Every day | ORAL | Status: DC
  Administered 2022-01-27: 5 mg via ORAL
  Filled 2022-01-27: qty 1

## 2022-01-27 MED ORDER — ZIPRASIDONE MESYLATE 20 MG IM SOLR
10.0000 mg | Freq: Once | INTRAMUSCULAR | Status: AC
  Administered 2022-01-27: 10 mg via INTRAMUSCULAR
  Filled 2022-01-27: qty 20

## 2022-01-27 MED ORDER — ZIPRASIDONE MESYLATE 20 MG IM SOLR
20.0000 mg | Freq: Once | INTRAMUSCULAR | Status: AC
  Administered 2022-01-27: 20 mg via INTRAMUSCULAR
  Filled 2022-01-27: qty 20

## 2022-01-27 NOTE — Consult Note (Signed)
Patient awake, ate dinner and NP went in to see patient who is disorganized, laughing inappropriately.  She does not know why she is here and how she got here.  She states she lives with her aunt.  Nobody is listed as contact.  Phone number listed nobody can be reached.  Review of old record states patient have bee hospitalized once at [facility] March 2023 and has multiple ER Visits for brief Psychosis and Polysubstance abuse.  Patient have been on Abilify, Olanzapine and Depakote.  Patient also have been hospitalized at [facility] in [place].  On one of these admissions she was prescribed Haldol Decanoate 75 mg LAI  once a month. UDS is positive for Cannabis. During my short interaction with patient she suddenly got of bed ran out of the room to the bathroom with lights off and door closed.  She was brought back to her room.  She went back to bed.  She got up again and went to the sink to wash her hair and stood in front of the Mirror laughing .  She came back to bed  and covered self and asked for sandwich which was given..  Assessment was terminated.  Olanzapine and Depakote started and EKG to be obtained for QTC interval evaluation. We will continue to monitor and evaluate when patient is ready.

## 2022-01-27 NOTE — Consult Note (Signed)
Patient is still sleep at this time.  Unable to assess.
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
## 2022-01-27 NOTE — BH Assessment (Signed)
Per Mortimer Fries, RN the pt is currently sleeping after receiving 20 mg Geodon. Clinician asked RN to contact her once the pt is awake and able to engage.    Vertell Novak, [place], [facility], [facility] Triage Specialist [phone]

## 2022-01-27 NOTE — BH Assessment (Addendum)
Clinician messaged Jeanie Sewer, RN: "Hey. It's Trey with TTS. Is the pt able to engage in the assessment, if so the pt will need to be placed in a private room. Is the pt medically cleared? Can you fax the pt's IVC paperwork to [phone]."   Clinician awaiting response.    Vertell Novak, [place], [facility], [facility] Triage Specialist [phone]
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
## 2022-01-27 NOTE — Progress Notes (Signed)
The patient remains asleep at this time.

## 2022-01-27 NOTE — Consult Note (Signed)
Unable to evaluate this time.  Patient was just given Geodon for severe agitation and being combative.

## 2022-01-27 NOTE — ED Notes (Signed)
Patient escorted to room 31 with [facility].  Patient is currently sleeping.  Resp even and non-labored.  Patient received medicaitons to calm her so we will complete assessment when she wakes.

## 2022-01-27 NOTE — Progress Notes (Signed)
Kiara [name] laying on the bed talking with NP Onuocha. Kiara [name] jumps up comes outside room to run. Kiara [name] goes into bathroom. Kiara [name] said she needs to use the bathroom but when she is escort back to her room shown the bathroom. [name] do not use the bathroom. Kiara [name] sits on the bed the goes toward sink and mirror to talk to herself in the mirror then she proceed to drink from her cup in the room and ask for water. Writer goes to get water for her to drink.

## 2022-01-27 NOTE — ED Notes (Signed)
Requesting hygiene products  decreased restlessness but continues with going in and out of the bathroom that is in her room.

## 2022-01-28 MED ORDER — LORAZEPAM 1 MG PO TABS
1.0000 mg | ORAL_TABLET | Freq: Once | ORAL | Status: AC
  Administered 2022-01-28: 1 mg via ORAL
  Filled 2022-01-28: qty 1

## 2022-01-28 MED ORDER — OLANZAPINE 10 MG PO TBDP
10.0000 mg | ORAL_TABLET | Freq: Two times a day (BID) | ORAL | Status: DC | PRN
  Administered 2022-01-28: 10 mg via ORAL
  Filled 2022-01-28: qty 1
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
## 2022-01-28 NOTE — Progress Notes (Signed)
[facility] Psych ED Progress Note  01/28/2022 10:55 AM Kiara [name]  MRN:  [id number]   Principal Problem: Brief psychotic disorder ([place]) Diagnosis:  Principal Problem:   Brief psychotic disorder ([place]) Active Problems:   Unspecified mood (affective) disorder [facility])   ED Assessment Time Calculation: Start Time: 1020 Stop Time: 1030 Total Time in Minutes (Assessment Completion): 10   Subjective:  Kiara [name], 22 y.o., female patient seen face to face by this provider, consulted with Dr. Dwyane Dee; and chart reviewed on 01/28/22.  During evaluation Kiara [name] is standing and pacing around room in no acute distress, she appears as if she is anxious. She is alert, unable to assess orientation, mood is anxious with bizarre affect. She does not speak, when provider ask questions and introduces self, she has a strange stare, and then smiles inappropriately. She does every so often walk off and go into her bathroom and just stare in the mirror and smiles. She does not answer or ask any questions. RN stated that she comes to doorway and pointed finger guns at staff, making threatening statements. Patient is easily redirectable per RN, patient also attempted to lunge at NT, again redirected back to room. She is compliant with medications. Patient does appear to be preoccupied and distracted in her thinking. Before provider left room, provider asked patient did she have any questions and patient asked if she could call her father, and would not say anything else. Provider informed her that if at any time she would like to talk with provider, she could, patient walked away and went back into bathroom.  There are no contacts in patient demographic section, patient would not give provider her fathers number.    Past Psychiatric History: Patient have bee hospitalized once at [facility] March 2023 and has multiple ER Visits for brief Psychosis and Polysubstance abuse. Patient have been on Abilify, Olanzapine and Depakote.  Patient also have been hospitalized at [facility] in [place].   [place] Scale:  Flowsheet Row ED to Hosp-Admission (Discharged) from 12/07/2021 in [place] ED from 11/27/2021 in [facility] ED from 11/23/2021 in [facility] [place] No Risk No Risk No Risk       Past Medical History:  Past Medical History:  Diagnosis Date   Hidradenitis suppurativa of multiple sites    No past surgical history on file. Family History: No family history on file.   Social History:  Social History   Substance and Sexual Activity  Alcohol Use Yes     Social History   Substance and Sexual Activity  Drug Use Yes   Types: Marijuana    Social History   Socioeconomic History   Marital status: Single    Spouse name: Not on file   Number of children: Not on file   Years of education: Not on file   Highest education level: Not on file  Occupational History   Not on file  Tobacco Use   Smoking status: Every Day    Types: Cigarettes   Smokeless tobacco: Never  Vaping Use   Vaping Use: Some days  Substance and Sexual Activity   Alcohol use: Yes   Drug use: Yes    Types: Marijuana   Sexual activity: Never    Birth control/protection: None  Other Topics Concern   Not on file  Social History Narrative   Not on file   Social Determinants of  Health   Financial Resource Strain: Not on file  Food Insecurity: Not on file  Transportation Needs: Unmet Transportation Needs (12/12/2021)   PRAPARE - Administrator, Civil Service (Medical): No    Lack of Transportation (Non-Medical): Yes  Physical Activity: Not on file  Stress: Not on file  Social Connections: Not on file    Sleep:  UTA  Appetite:   UTA  Current Medications: Current Facility-Administered Medications  Medication Dose Route Frequency Provider Last Rate Last Admin   divalproex (DEPAKOTE  ER) 24 hr tablet 250 mg  250 mg Oral Daily Onuoha, Josephine C, NP   250 mg at 01/28/22 0900   OLANZapine zydis (ZYPREXA) disintegrating tablet 10 mg  10 mg Oral BID PRN Melene Plan, DO   10 mg at 01/28/22 0726   OLANZapine zydis (ZYPREXA) disintegrating tablet 5 mg  5 mg Oral QHS Onuoha, Josephine C, NP   5 mg at 01/27/22 2114   Current Outpatient Medications  Medication Sig Dispense Refill   acetaminophen (TYLENOL) 500 MG tablet Take 1,000 mg by mouth every 6 (six) hours as needed for mild pain.     ascorbic acid (VITAMIN C) 500 MG tablet Take 500 mg by mouth daily.     Elderberry 500 MG CAPS Take 1,000 mg by mouth daily.     ibuprofen (ADVIL) 600 MG tablet Take 1 tablet (600 mg total) by mouth every 6 (six) hours as needed for headache, mild pain, moderate pain or cramping. 30 tablet 2   oxyCODONE (OXY IR/ROXICODONE) 5 MG immediate release tablet Take 1 tablet (5 mg total) by mouth every 6 (six) hours as needed for severe pain or breakthrough pain. 6 tablet 0   Zinc 50 MG TABS Take 50 mg by mouth daily.      Lab Results:  Results for orders placed or performed during the hospital encounter of 01/27/22 (from the past 48 hour(s))  Resp panel by RT-PCR (RSV, Flu A&B, Covid) Anterior Nasal Swab     Status: None   Collection Time: 01/27/22 12:25 PM   Specimen: Anterior Nasal Swab  Result Value Ref Range   SARS Coronavirus 2 by RT PCR NEGATIVE NEGATIVE    Comment: (NOTE) SARS-CoV-2 target nucleic acids are NOT DETECTED.  The SARS-CoV-2 RNA is generally detectable in upper respiratory specimens during the acute phase of infection. The lowest concentration of SARS-CoV-2 viral copies this assay can detect is 138 copies/mL. A negative result does not preclude SARS-Cov-2 infection and should not be used as the sole basis for treatment or other patient management decisions. A negative result may occur with  improper specimen collection/handling, submission of specimen other than nasopharyngeal  swab, presence of viral mutation(s) within the areas targeted by this assay, and inadequate number of viral copies(<138 copies/mL). A negative result must be combined with clinical observations, patient history, and epidemiological information. The expected result is Negative.  Fact Sheet for Patients:  BloggerCourse.com  Fact Sheet for Healthcare Providers:  SeriousBroker.it  This test is no t yet approved or cleared by the [place] FDA and  has been authorized for detection and/or diagnosis of SARS-CoV-2 by FDA under an Emergency Use Authorization (EUA). This EUA will remain  in effect (meaning this test can be used) for the duration of the COVID-19 declaration under Section 564(b)(1) of the Act, 21 U.S.C.section 360bbb-3(b)(1), unless the authorization is terminated  or revoked sooner.       Influenza A by PCR NEGATIVE NEGATIVE   Influenza  B by PCR NEGATIVE NEGATIVE    Comment: (NOTE) The Xpert Xpress SARS-CoV-2/FLU/RSV plus assay is intended as an aid in the diagnosis of influenza from Nasopharyngeal swab specimens and should not be used as a sole basis for treatment. Nasal washings and aspirates are unacceptable for Xpert Xpress SARS-CoV-2/FLU/RSV testing.  Fact Sheet for Patients: BloggerCourse.com  Fact Sheet for Healthcare Providers: SeriousBroker.it  This test is not yet approved or cleared by the [place] FDA and has been authorized for detection and/or diagnosis of SARS-CoV-2 by FDA under an Emergency Use Authorization (EUA). This EUA will remain in effect (meaning this test can be used) for the duration of the COVID-19 declaration under Section 564(b)(1) of the Act, 21 U.S.C. section 360bbb-3(b)(1), unless the authorization is terminated or revoked.     Resp Syncytial Virus by PCR NEGATIVE NEGATIVE    Comment: (NOTE) Fact Sheet for  Patients: BloggerCourse.com  Fact Sheet for Healthcare Providers: SeriousBroker.it  This test is not yet approved or cleared by the [place] FDA and has been authorized for detection and/or diagnosis of SARS-CoV-2 by FDA under an Emergency Use Authorization (EUA). This EUA will remain in effect (meaning this test can be used) for the duration of the COVID-19 declaration under Section 564(b)(1) of the Act, 21 U.S.C. section 360bbb-3(b)(1), unless the authorization is terminated or revoked.  Performed at [facility], [address], [place], [place] [address]   Comprehensive metabolic panel     Status: Abnormal   Collection Time: 01/27/22  1:10 PM  Result Value Ref Range   Sodium 137 135 - 145 mmol/L   Potassium 3.3 (L) 3.5 - 5.1 mmol/L   Chloride 103 98 - 111 mmol/L   CO2 23 22 - 32 mmol/L   Glucose, Bld 92 70 - 99 mg/dL    Comment: Glucose reference range applies only to samples taken after fasting for at least 8 hours.   BUN 12 6 - 20 mg/dL   Creatinine, Ser 4.01 0.44 - 1.00 mg/dL   Calcium 9.1 8.9 - 02.7 mg/dL   Total Protein 7.7 6.5 - 8.1 g/dL   Albumin 4.3 3.5 - 5.0 g/dL   AST 32 15 - 41 U/L   ALT 19 0 - 44 U/L   Alkaline Phosphatase 58 38 - 126 U/L   Total Bilirubin 0.9 0.3 - 1.2 mg/dL   GFR, Estimated >25 >36 mL/min    Comment: (NOTE) Calculated using the CKD-EPI Creatinine Equation (2021)    Anion gap 11 5 - 15    Comment: Performed at [facility], [address] [place], [place] [address]  Ethanol     Status: None   Collection Time: 01/27/22  1:10 PM  Result Value Ref Range   Alcohol, Ethyl (B) <10 <10 mg/dL    Comment: (NOTE) Lowest detectable limit for serum alcohol is 10 mg/dL.  For medical purposes only. Performed at [facility], [address], [place], [place] [address]   CBC with Diff     Status: None   Collection Time: 01/27/22  1:10  PM  Result Value Ref Range   WBC 9.6 4.0 - 10.5 K/uL   RBC 4.34 3.87 - 5.11 MIL/uL   Hemoglobin 12.5 12.0 - 15.0 g/dL   HCT 95.6 38.7 - 56.4 %   MCV 87.8 80.0 - 100.0 fL   MCH 28.8 26.0 - 34.0 pg   MCHC 32.8 30.0 - 36.0 g/dL   RDW 33.2 95.1 - 88.4 %  Platelets 207 150 - 400 K/uL   nRBC 0.0 0.0 - 0.2 %   Neutrophils Relative % 69 %   Neutro Abs 6.6 1.7 - 7.7 K/uL   Lymphocytes Relative 20 %   Lymphs Abs 1.9 0.7 - 4.0 K/uL   Monocytes Relative 10 %   Monocytes Absolute 1.0 0.1 - 1.0 K/uL   Eosinophils Relative 1 %   Eosinophils Absolute 0.1 0.0 - 0.5 K/uL   Basophils Relative 0 %   Basophils Absolute 0.0 0.0 - 0.1 K/uL   Immature Granulocytes 0 %   Abs Immature Granulocytes 0.03 0.00 - 0.07 K/uL    Comment: Performed at [facility], [place] [address] [place], [place] [address]  Salicylate level     Status: Abnormal   Collection Time: 01/27/22  1:10 PM  Result Value Ref Range   Salicylate Lvl <5.9 (L) 7.0 - 30.0 mg/dL    Comment: Performed at [facility], [place] [address], [place], [place] [address]  Acetaminophen level     Status: Abnormal   Collection Time: 01/27/22  1:10 PM  Result Value Ref Range   Acetaminophen (Tylenol), Serum <10 (L) 10 - 30 ug/mL    Comment: (NOTE) Therapeutic concentrations vary significantly. A range of 10-30 ug/mL  may be an effective concentration for many patients. However, some  are best treated at concentrations outside of this range. Acetaminophen concentrations >150 ug/mL at 4 hours after ingestion  and >50 ug/mL at 12 hours after ingestion are often associated with  toxic reactions.  Performed at [facility], [place] [address] [place], [place] [address]   I-Stat beta hCG blood, ED     Status: None   Collection Time: 01/27/22  1:20 PM  Result Value Ref Range   I-stat hCG, quantitative <5.0 <5 mIU/mL   Comment 3            Comment:   GEST. AGE      CONC.  (mIU/mL)   <=1 WEEK         5 - 50     2 WEEKS       50 - 500     3 WEEKS       100 - 10,000     4 WEEKS     1,000 - 30,000        FEMALE AND NON-PREGNANT FEMALE:     LESS THAN 5 mIU/mL   Urine rapid drug screen (hosp performed)     Status: Abnormal   Collection Time: 01/27/22  1:30 PM  Result Value Ref Range   Opiates NONE DETECTED NONE DETECTED   Cocaine NONE DETECTED NONE DETECTED   Benzodiazepines NONE DETECTED NONE DETECTED   Amphetamines NONE DETECTED NONE DETECTED   Tetrahydrocannabinol POSITIVE (A) NONE DETECTED   Barbiturates NONE DETECTED NONE DETECTED    Comment: (NOTE) DRUG SCREEN FOR MEDICAL PURPOSES ONLY.  IF CONFIRMATION IS NEEDED FOR ANY PURPOSE, NOTIFY LAB WITHIN 5 DAYS.  LOWEST DETECTABLE LIMITS FOR URINE DRUG SCREEN Drug Class                     Cutoff (ng/mL) Amphetamine and metabolites    1000 Barbiturate and metabolites    200 Benzodiazepine                 200 Opiates and metabolites        300 Cocaine and metabolites        300 THC  50 Performed at [facility], [address] [place], [place] [address]   Urinalysis, Routine w reflex microscopic     Status: Abnormal   Collection Time: 01/27/22  1:30 PM  Result Value Ref Range   Color, Urine AMBER (A) YELLOW    Comment: BIOCHEMICALS MAY BE AFFECTED BY COLOR   APPearance HAZY (A) CLEAR   Specific Gravity, Urine 1.036 (H) 1.005 - 1.030   pH 5.0 5.0 - 8.0   Glucose, UA NEGATIVE NEGATIVE mg/dL   Hgb urine dipstick NEGATIVE NEGATIVE   Bilirubin Urine SMALL (A) NEGATIVE   Ketones, ur 20 (A) NEGATIVE mg/dL   Protein, ur 621 (A) NEGATIVE mg/dL   Nitrite NEGATIVE NEGATIVE   Leukocytes,Ua NEGATIVE NEGATIVE   RBC / HPF 11-20 0 - 5 RBC/hpf   WBC, UA 0-5 0 - 5 WBC/hpf   Bacteria, UA NONE SEEN NONE SEEN   Squamous Epithelial / HPF 0-5 0 - 5 /HPF   Mucus PRESENT    Hyaline Casts, UA PRESENT     Comment: Performed at [facility], [address] [place], [place]  [address]    Blood Alcohol level:  Lab Results  Component Value Date   [facility] <10 01/27/2022   ETH <10 03/20/2021    Physical Findings:  CIWA:    COWS:     Musculoskeletal: Strength & Muscle Tone: within normal limits Gait & Station: normal Patient leans: N/A  Psychiatric Specialty Exam:  Presentation  General Appearance:  Appropriate for Environment  Eye Contact: Fair  Speech: Clear and Coherent  Speech Volume: Normal  Handedness: Right   Mood and Affect  Mood: Euthymic  Affect: Appropriate   Thought Process  Thought Processes: -- (unable to assess)  Descriptions of Associations:-- (unable to assess)  Orientation:-- (unable to assess)  Thought Content:Other (comment) (unable to assess)  History of Schizophrenia/Schizoaffective disorder:No  Duration of Psychotic Symptoms:Greater than six months  Hallucinations:Hallucinations: Other (comment) (unable to assess)  Ideas of Reference:-- (unable to assess)  Suicidal Thoughts:Suicidal Thoughts: -- (unable to assess)  Homicidal Thoughts:Homicidal Thoughts: -- (unable to assess)   Sensorium  Memory: Immediate Fair; Recent Fair; Remote Fair  Judgment: Fair  Insight: Poor   Executive Functions  Concentration: Fair  Attention Span: Fair  Recall: [employer] of Knowledge: Fair  Language: Fair   Psychomotor Activity  Psychomotor Activity:No data recorded  Assets  Assets: Housing; Leisure Time; Physical Health; Resilience   Sleep  Sleep:No data recorded   Physical Exam: Physical Exam Pulmonary:     Effort: Pulmonary effort is normal.  Musculoskeletal:        General: Normal range of motion.  Neurological:     Mental Status: She is alert.  Psychiatric:        Attention and Perception: She is inattentive.        Mood and Affect: Mood is anxious.        Speech: Speech normal.        Behavior: Behavior is uncooperative.        Judgment: Judgment is impulsive and  inappropriate.    Review of Systems  HENT: Negative.    Musculoskeletal: Negative.   Psychiatric/Behavioral:  Positive for hallucinations.    Blood pressure 99/60, pulse (!) 118, temperature 97.6 F (36.4 C), temperature source Oral, resp. rate 20, weight 61 kg, SpO2 100 %. Body mass index is 23.82 kg/m.   Medical Decision Making: Patient continues to require inpatient Psychiatric hospitalization. Patient has been compliant with  medications, no other behaviors have been noted.    Duff Pozzi MOTLEY-MANGRUM, PMHNP 01/28/2022, 10:55 AM

## 2022-01-28 NOTE — ED Notes (Signed)
Pt made lunge towards NT sitter. Security at bedside able to redirect pt to her bed. PRN medication given. Pt complaint with oral medication administration.

## 2022-01-28 NOTE — ED Provider Notes (Addendum)
Emergency Medicine Observation Re-evaluation Note  Kiara [name] is a 22 y.o. female, seen on rounds today.  Pt initially presented to the ED for complaints of Psychiatric Evaluation Currently, the patient is pacing in the room, intermittently yelling.  Had been seen by psychiatry yesterday and they had stopped their interview due to agitation.  The patient has no PRNs ordered.  Physical Exam  BP 99/60 (BP Location: Left Arm)   Pulse (!) 118   Temp 97.6 F (36.4 C) (Oral)   Resp 20   Wt 61 kg   SpO2 100%   BMI 23.82 kg/m  Physical Exam General: NAD, pacing Cardiac: RRR Lungs: no tachypnea, bilateral chest rise Psych: Directable, somewhat agitated.  Seems to be responding to internal stimuli  ED Course / MDM  EKG:   I have reviewed the labs performed to date as well as medications administered while in observation.  Recent changes in the last 24 hours include patient with a history of substance use disorder here with acute agitation.  Seen by psychiatry, seems they are titrating antipsychotics.  Started on olanzapine at night last night.  Plan  Current plan is for psychiatric disposition.  Patient without any PRNs and is actively pacing is had to be Geodon to multiple times already.  Will try to add olanzapine as needed.     Patient with continued agitation, given ativan with some improvement.   Patient accepted into inpatient facility.       Deno Etienne, DO 01/28/22 1451

## 2022-01-28 NOTE — Progress Notes (Signed)
LCSW Progress Note  [id number]   Kiara [name]  01/28/2022  12:27 PM  Description:   Inpatient Psychiatric Referral  Patient was recommended inpatient per [facility], [place]. There are no available beds at [facility]. Patient was referred to the following facilities:   Destination  Service Provider Address Phone Fax  [place] [place] [address] [phone]  [facility]  [address] [place] [address] [phone]  [place], [place] [address] [phone]  [facility]  [place], [place] [address] [phone]  [facility]  [place], [place] [address] [place]  [facility]  [address] [place] [address] [phone]  [place], [place] [address] [phone] [facility]  [address] [place], [place] [address] [phone] [facility]  [place] [place] [address] [place]  [facility]  [address] [place] [address] [phone]  [facility]  [address], [place] [address] [phone]  [facility]  [address] [place] [address] [phone] [place]  [address] [place] [address] [phone] [facility]  [address] [place] [address] [phone] [facility]  [address] [place] [address] [place]  [address] [place] [address]  [phone] [facility]  [address] [place] [address] [phone] [facility]  [address] [place] [address] [phone] [facility]  [address], [place] [address] [phone]  [facility]  [address] [place] [address] [place]  [facility]  [address] [place] [address] [phone] [place]  [address], [place] [address] [phone]  [address] [place] [address] [phone]  [facility]  [address] [place] [address] [phone]  [place] [place] [address] [phone]    Situation ongoing, CSW to continue following and update chart as more information becomes available.      Herbie Baltimore  01/28/2022 12:27 PM

## 2022-01-28 NOTE — Progress Notes (Addendum)
Pt was accepted to [place] 01/28/2022, pending IVC paperwork faxed to [phone]  Pt meets inpatient criteria per Michaele Offer, [place]  Attending Physician will be Rosemarie Beath, MD  Report can be called to: [phone]  Bed is ready now  Care Team Notified: Michaele Offer, PMHNP, Enriqueta Shutter, RN, and Lynnda Shields, RN  [place], [place]  01/28/2022 12:53 PM

## 2022-01-28 NOTE — ED Notes (Signed)
Pt stripping down naked in room. Redirected by NT sitter at bedside.
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
## 2022-02-18 ENCOUNTER — Encounter (HOSPITAL_COMMUNITY): Payer: Self-pay

## 2022-02-18 ENCOUNTER — Emergency Department (HOSPITAL_COMMUNITY)
Admission: EM | Admit: 2022-02-18 | Discharge: 2022-02-18 | Discharge status: Against Medical Advice | Payer: Medicaid Other | Attending: Emergency Medicine | Admitting: Emergency Medicine

## 2022-02-18 ENCOUNTER — Emergency Department (HOSPITAL_COMMUNITY)
Admission: EM | Admit: 2022-02-18 | Discharge: 2022-02-18 | Disposition: A | Discharge status: Home / Self Care | Payer: Medicaid Other | Source: Home / Self Care | Attending: Emergency Medicine | Admitting: Emergency Medicine

## 2022-02-18 ENCOUNTER — Other Ambulatory Visit: Payer: Self-pay

## 2022-02-18 DIAGNOSIS — Z5900 Homelessness unspecified: Secondary | ICD-10-CM | POA: Diagnosis not present

## 2022-02-18 DIAGNOSIS — R1030 Lower abdominal pain, unspecified: Secondary | ICD-10-CM | POA: Insufficient documentation

## 2022-02-18 DIAGNOSIS — Z202 Contact with and (suspected) exposure to infections with a predominantly sexual mode of transmission: Secondary | ICD-10-CM | POA: Insufficient documentation

## 2022-02-18 DIAGNOSIS — Z5329 Procedure and treatment not carried out because of patient's decision for other reasons: Secondary | ICD-10-CM | POA: Insufficient documentation

## 2022-02-18 DIAGNOSIS — F419 Anxiety disorder, unspecified: Secondary | ICD-10-CM | POA: Insufficient documentation

## 2022-02-18 LAB — WET PREP, GENITAL
Clue Cells Wet Prep HPF POC: NONE SEEN
Sperm: NONE SEEN
Trich, Wet Prep: NONE SEEN
WBC, Wet Prep HPF POC: <10 (ref <10)
Yeast Wet Prep HPF POC: NONE SEEN

## 2022-02-18 NOTE — ED Triage Notes (Signed)
Pt states she just left from [facility]. Wants to be evaluated for STDs. States she was just diagnosed with chlamydia in December, took her abx and currently has no symptoms. Pt erratic during triage. States she also wants an x-ray of her arm where she had an IV and also of her abscess on her ovaries. Pt tangential with her thinking. States "just put me to sleep". Denies SI/HI, no AVH. Pt voluntary at this time.

## 2022-02-18 NOTE — Discharge Instructions (Signed)
Left arm: Your ultrasound did not reveal any evidence of foreign body or retained catheter in any of the tissue of the arm.  Your arm appears to have normal veins and arteries on my exam.  There is no evidence of any overlying infection.  If you have any additional pain you can use ibuprofen and Tylenol as directed.  Abdomen, uterus, ovaries: I do not see any evidence of cystic structures, abscess, or other abnormality on ultrasound evaluation, the results of your gonorrhea, chlamydia probe that was performed earlier today will not result for 24 to 48 hours, you can check on the results on your patient portal, and present to the [facility] if any concern for ongoing infection.

## 2022-02-18 NOTE — ED Provider Notes (Signed)
[place] [facility] Provider Note   CSN: [id number] Arrival date & time: 02/18/22  1007     History  Chief Complaint  Patient presents with   Psychiatric Evaluation    Kiara [name] is a 22 y.o. female with past medical history significant for homelessness, previous tubo-ovarian abscess, psychotic disorder and other unspecified mood disorder who presents after just eloping from the [facility] emergency department.  She went to be evaluated for STDs, she had an unremarkable wet prep and is pending GC, chlamydia.  She believes that there is an IV in her left AC from previous evaluation in the emergency department, and wants "a recheck of the tubo-ovarian abscess" in abdomen. She reports some mild pain since her last chlamydia diagnosis.  HPI     Home Medications Prior to Admission medications   Medication Sig Start Date End Date Taking? Authorizing Provider  acetaminophen (TYLENOL) 500 MG tablet Take 1,000 mg by mouth every 6 (six) hours as needed for mild pain.    [provider]  ascorbic acid (VITAMIN C) 500 MG tablet Take 500 mg by mouth daily.    [provider]  Elderberry 500 MG CAPS Take 1,000 mg by mouth daily.    [provider]  ibuprofen (ADVIL) 600 MG tablet Take 1 tablet (600 mg total) by mouth every 6 (six) hours as needed for headache, mild pain, moderate pain or cramping. 12/12/21   Griffin Basil, MD  oxyCODONE (OXY IR/ROXICODONE) 5 MG immediate release tablet Take 1 tablet (5 mg total) by mouth every 6 (six) hours as needed for severe pain or breakthrough pain. 12/12/21   Griffin Basil, MD  Zinc 50 MG TABS Take 50 mg by mouth daily.    [provider]      Allergies    Patient has no known allergies.    Review of Systems   Review of Systems  All other systems reviewed and are negative.   Physical Exam Updated Vital Signs BP (!) 138/92 (BP Location: Right Arm)   Pulse 78   Temp  97.9 F (36.6 C) (Oral)   Resp 18   SpO2 99%  Physical Exam Vitals and nursing note reviewed.  Constitutional:      General: She is not in acute distress.    Appearance: Normal appearance.  HENT:     Head: Normocephalic and atraumatic.  Eyes:     General:        Right eye: No discharge.        Left eye: No discharge.  Cardiovascular:     Rate and Rhythm: Normal rate and regular rhythm.     Heart sounds: No murmur heard.    No friction rub. No gallop.  Pulmonary:     Effort: Pulmonary effort is normal.     Breath sounds: Normal breath sounds.  Abdominal:     General: Bowel sounds are normal.     Palpations: Abdomen is soft.     Comments: Point-of-care ultrasound evaluation of the lower abdomen revealed no evidence of abnormalities around bilateral ovaries, no evidence tubo-ovarian abscess.  Patient has no significant tenderness to palpation of the lower abdomen, no rebound, rigidity, guarding.  Skin:    General: Skin is warm and dry.     Capillary Refill: Capillary refill takes less than 2 seconds.     Comments: Plan of care vascular ultrasound of the entire left upper extremity from [facility] to shoulder revealed no evidence of  foreign body, abscess, or other abnormality.  No overlying skin changes suggestive of cellulitis, abscess formation.  Neurological:     Mental Status: She is alert and oriented to person, place, and time.  Psychiatric:        Mood and Affect: Mood normal.        Behavior: Behavior normal.     ED Results / Procedures / Treatments   Labs (all labs ordered are listed, but only abnormal results are displayed) Labs Reviewed - No data to display  EKG None  Radiology No results found.  Procedures Procedures    Medications Ordered in ED Medications - No data to display  ED Course/ Medical Decision Making/ A&P                             Medical Decision Making  An overall well-appearing 22 year old female who presents with concern for evaluation  for STD, foreign body in left [facility], as well as "recheck of my abscess".  Patient was just seen and eloped from [facility] emergency department earlier this morning, she had evaluation of her buttocks, wet prep performed, and she also has a pending GC, chlamydia swab.  On my exam she has no significant tenderness to palpation of the abdomen, and performed a point-of-care ultrasound of the lower abdomen, uterus, ovaries which was unremarkable, did not show any evidence of any abscess formation, I performed a point-of-care ultrasound of the left upper extremity from left AC to left shoulder which revealed normal vasculature, no evidence of foreign body or retained IV catheter.  Overall patient is well-appearing, I think her concerns today are not consistent with her physical exam, she is in no acute distress, stable vital signs other than mild hypertension, blood pressure 138/92.  There is no evidence of any abdominal pain, new abscess formation or other abnormality, no evidence of foreign body in the left AC.  Discussed that her wet prep was negative performed just earlier today, and that her STI screening will come back in 24 to 48 hours.  Overall low clinical suspicion for any ongoing acute intra-abdominal process and I think that the patient is stable for discharge at this time.  She does seem to be showing some delusion related to her medical condition but she is otherwise very reasonable with no SI, HI, or AVH, I think that she is stable at this time from a psychiatric perspective.  Extensive return precautions given. Final Clinical Impression(s) / ED Diagnoses Final diagnoses:  Lower abdominal pain    Rx / DC Orders ED Discharge Orders     None         Anselmo Pickler, PA-C 02/18/22 [place], DO 02/18/22 1226

## 2022-02-18 NOTE — ED Provider Notes (Signed)
[place] Provider Note   CSN: [id number] Arrival date & time: 02/18/22  7829     History  Chief Complaint  Patient presents with   Exposure to STD    Kiara [name] is a 22 y.o. female.   Exposure to STD  Patient presents for multiple concerns.  Medical history includes homelessness, TOA, and prior psychotic episodes.  She had a psychiatric hospitalization last month.  She states that she previously had a IV in her left [facility] and subsequently had an area of swelling there.  The swelling has since resolved.  She was worried that the IV was still in there.  She also states that she was recently treated for chlamydia.  She completed antibiotic treatment.  She denies any recent dysuria or vaginal discharge.  Despite absence of symptoms, she is concerned about persistent infection.  She states that she also had bumps on her buttocks which have since resolved.     Home Medications Prior to Admission medications   Medication Sig Start Date End Date Taking? Authorizing Provider  acetaminophen (TYLENOL) 500 MG tablet Take 1,000 mg by mouth every 6 (six) hours as needed for mild pain.    [provider]  ascorbic acid (VITAMIN C) 500 MG tablet Take 500 mg by mouth daily.    [provider]  Elderberry 500 MG CAPS Take 1,000 mg by mouth daily.    [provider]  ibuprofen (ADVIL) 600 MG tablet Take 1 tablet (600 mg total) by mouth every 6 (six) hours as needed for headache, mild pain, moderate pain or cramping. 12/12/21   Griffin Basil, MD  oxyCODONE (OXY IR/ROXICODONE) 5 MG immediate release tablet Take 1 tablet (5 mg total) by mouth every 6 (six) hours as needed for severe pain or breakthrough pain. 12/12/21   Griffin Basil, MD  Zinc 50 MG TABS Take 50 mg by mouth daily.    [provider]      Allergies    Patient has no known allergies.    Review of Systems   Review of Systems  Genitourinary:   Negative for dysuria, pelvic pain and vaginal bleeding.  Psychiatric/Behavioral:  The patient is nervous/anxious.   All other systems reviewed and are negative.   Physical Exam Updated Vital Signs BP 131/81 (BP Location: Right Arm)   Pulse 81   Temp 98.4 F (36.9 C) (Oral)   Resp 12   Ht 5\' 3"  (1.6 m)   Wt 60.8 kg   SpO2 100%   BMI 23.74 kg/m  Physical Exam Vitals and nursing note reviewed. Exam conducted with a chaperone present.  Constitutional:      General: She is not in acute distress.    Appearance: Normal appearance. She is well-developed. She is not ill-appearing, toxic-appearing or diaphoretic.  HENT:     Head: Normocephalic and atraumatic.     Right Ear: External ear normal.     Left Ear: External ear normal.     Nose: Nose normal.     Mouth/Throat:     Mouth: Mucous membranes are moist.  Eyes:     Extraocular Movements: Extraocular movements intact.     Conjunctiva/sclera: Conjunctivae normal.  Cardiovascular:     Rate and Rhythm: Normal rate and regular rhythm.     Heart sounds: No murmur heard. Pulmonary:     Effort: Pulmonary effort is normal. No respiratory distress.  Abdominal:     General: There is no distension.  Palpations: Abdomen is soft.     Tenderness: There is no abdominal tenderness.  Genitourinary:    Comments: No skin changes appreciable on buttock area. Musculoskeletal:        General: No swelling. Normal range of motion.     Cervical back: Normal range of motion and neck supple.     Right lower leg: No edema.     Left lower leg: No edema.  Skin:    General: Skin is warm and dry.     Capillary Refill: Capillary refill takes less than 2 seconds.     Coloration: Skin is not jaundiced or pale.  Neurological:     General: No focal deficit present.     Mental Status: She is alert and oriented to person, place, and time.     Cranial Nerves: No cranial nerve deficit.     Sensory: No sensory deficit.     Motor: No weakness.      Coordination: Coordination normal.  Psychiatric:        Mood and Affect: Mood normal.        Behavior: Behavior normal.        Thought Content: Thought content normal.        Judgment: Judgment normal.     ED Results / Procedures / Treatments   Labs (all labs ordered are listed, but only abnormal results are displayed) Labs Reviewed  WET PREP, GENITAL  GC/CHLAMYDIA PROBE AMP ([place]) NOT AT [facility]    EKG None  Radiology No results found.  Procedures Procedures    Medications Ordered in ED Medications - No data to display  ED Course/ Medical Decision Making/ A&P                             Medical Decision Making Amount and/or Complexity of Data Reviewed Labs: ordered.   Patient presenting for multiple complaints.  Among these are prior area of swelling to her left [facility] where a IV was placed when she was in the hospital.  On exam, no swelling or foreign bodies appreciated.  Although patient is requesting an x-ray of this area, she was given reassurance, and informed that x-ray would not provide any utility.  She is concerned about STI.  She states that she was previously treated for chlamydia recently.  She denies any recent dysuria, vaginal irritation, or vaginal discharge.  Patient is agreeable to self swab for test of cure.  Given absence of symptoms, will not treat with any further antibiotics at this time.  Patient states that she also had some bumps on her buttock area.  Inspection of this area was done with chaperone present.  No skin changes are appreciated.  Patient was offered reassurance for this as well.  Patient to undergo urinalysis and self swabbing.  Wet prep was negative.  Prior to providing urine sample, patient eloped.        Final Clinical Impression(s) / ED Diagnoses Final diagnoses:  Anxiety    Rx / DC Orders ED Discharge Orders     None         Godfrey Pick, MD 02/18/22 1550

## 2022-02-18 NOTE — ED Notes (Signed)
Pt states that she currently does not have any symptoms.  Pt reports that she had "chlamydia a while ago and took antibiotics."  No new orders at this time.

## 2022-02-18 NOTE — ED Triage Notes (Signed)
Patient here for STD testing.  Patient requesting all tests.  Also reports there is a knot on left arm where IV was and wants that checked. Denies std symptoms.

## 2022-02-20 LAB — GC/CHLAMYDIA PROBE AMP (~~LOC~~) NOT AT ARMC
Chlamydia: NEGATIVE
Comment: NEGATIVE
Comment: NORMAL
Neisseria Gonorrhea: NEGATIVE

## 2022-02-28 ENCOUNTER — Other Ambulatory Visit: Payer: Self-pay

## 2022-02-28 ENCOUNTER — Encounter (HOSPITAL_COMMUNITY): Payer: Self-pay

## 2022-02-28 ENCOUNTER — Emergency Department (HOSPITAL_COMMUNITY)
Admission: EM | Admit: 2022-02-28 | Discharge: 2022-03-03 | Disposition: A | Discharge status: Home / Self Care | Payer: Medicaid Other | Attending: Emergency Medicine | Admitting: Emergency Medicine

## 2022-02-28 DIAGNOSIS — F250 Schizoaffective disorder, bipolar type: Secondary | ICD-10-CM | POA: Diagnosis present

## 2022-02-28 DIAGNOSIS — R451 Restlessness and agitation: Secondary | ICD-10-CM

## 2022-02-28 DIAGNOSIS — Z1152 Encounter for screening for COVID-19: Secondary | ICD-10-CM | POA: Diagnosis not present

## 2022-02-28 DIAGNOSIS — Z79899 Other long term (current) drug therapy: Secondary | ICD-10-CM | POA: Insufficient documentation

## 2022-02-28 DIAGNOSIS — Z046 Encounter for general psychiatric examination, requested by authority: Secondary | ICD-10-CM

## 2022-02-28 LAB — COMPREHENSIVE METABOLIC PANEL
ALT: 22 U/L (ref 0–44)
AST: 29 U/L (ref 15–41)
Albumin: 4.1 g/dL (ref 3.5–5.0)
Alkaline Phosphatase: 57 U/L (ref 38–126)
Anion gap: 10 (ref 5–15)
BUN: 16 mg/dL (ref 6–20)
CO2: 22 mmol/L (ref 22–32)
Calcium: 9 mg/dL (ref 8.9–10.3)
Chloride: 98 mmol/L (ref 98–111)
Creatinine, Ser: 0.65 mg/dL (ref 0.44–1.00)
GFR, Estimated: >60 mL/min (ref >60)
Glucose, Bld: 81 mg/dL (ref 70–99)
Potassium: 3.4 mmol/L — ABNORMAL LOW (ref 3.5–5.1)
Sodium: 130 mmol/L — ABNORMAL LOW (ref 135–145)
Total Bilirubin: 0.5 mg/dL (ref 0.3–1.2)
Total Protein: 7.5 g/dL (ref 6.5–8.1)

## 2022-02-28 LAB — CBC
HCT: 41.6 % (ref 36.0–46.0)
Hemoglobin: 13.6 g/dL (ref 12.0–15.0)
MCH: 30 pg (ref 26.0–34.0)
MCHC: 32.7 g/dL (ref 30.0–36.0)
MCV: 91.8 fL (ref 80.0–100.0)
Platelets: 331 10*3/uL (ref 150–400)
RBC: 4.53 MIL/uL (ref 3.87–5.11)
RDW: 16.4 % — ABNORMAL HIGH (ref 11.5–15.5)
WBC: 8.4 10*3/uL (ref 4.0–10.5)
nRBC: 0 % (ref 0.0–0.2)

## 2022-02-28 LAB — I-STAT BETA HCG BLOOD, ED (MC, WL, AP ONLY): I-stat hCG, quantitative: <5 m[IU]/mL (ref <5)

## 2022-02-28 LAB — ACETAMINOPHEN LEVEL: Acetaminophen (Tylenol), Serum: <10 ug/mL — ABNORMAL LOW (ref 10–30)

## 2022-02-28 LAB — ETHANOL: Alcohol, Ethyl (B): <10 mg/dL (ref <10)

## 2022-02-28 LAB — SALICYLATE LEVEL: Salicylate Lvl: <7 mg/dL — ABNORMAL LOW (ref 7.0–30.0)

## 2022-02-28 LAB — RAPID URINE DRUG SCREEN, HOSP PERFORMED
Amphetamines: NOT DETECTED
Barbiturates: NOT DETECTED
Benzodiazepines: NOT DETECTED
Cocaine: NOT DETECTED
Opiates: NOT DETECTED
Tetrahydrocannabinol: POSITIVE — AB

## 2022-02-28 MED ORDER — POTASSIUM CHLORIDE CRYS ER 20 MEQ PO TBCR
20.0000 meq | EXTENDED_RELEASE_TABLET | Freq: Once | ORAL | Status: AC
  Administered 2022-02-28: 20 meq via ORAL
  Filled 2022-02-28: qty 1
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
## 2022-02-28 NOTE — ED Notes (Signed)
Pt. Is currently in her room talking to herself. Will speak with MD

## 2022-02-28 NOTE — ED Notes (Signed)
Patient is alseep in her room. No signs of distress noted at this time.

## 2022-02-28 NOTE — ED Notes (Signed)
Patient is currently in her room having a crying episode. Message Dr. Oswald Hillock at 2215 about placing PRN mediations for the patient. No new orders at this time.

## 2022-02-28 NOTE — ED Notes (Signed)
Pt wanded, and changed into purple scrubs. Belongings placed in locker 28. 3 shirts and a pair of shoes.

## 2022-03-01 DIAGNOSIS — F250 Schizoaffective disorder, bipolar type: Secondary | ICD-10-CM | POA: Diagnosis present

## 2022-03-01 MED ORDER — STERILE WATER FOR INJECTION IJ SOLN
INTRAMUSCULAR | Status: AC
  Filled 2022-03-01: qty 10

## 2022-03-01 MED ORDER — LORAZEPAM 1 MG PO TABS
2.0000 mg | ORAL_TABLET | Freq: Once | ORAL | Status: AC
  Administered 2022-03-01: 2 mg via ORAL
  Filled 2022-03-01: qty 2

## 2022-03-01 MED ORDER — DIVALPROEX SODIUM ER 500 MG PO TB24
500.0000 mg | ORAL_TABLET | Freq: Every day | ORAL | Status: DC
  Administered 2022-03-01 – 2022-03-02 (×2): 500 mg via ORAL
  Filled 2022-03-01 (×2): qty 1

## 2022-03-01 MED ORDER — HALOPERIDOL 1 MG PO TABS
2.0000 mg | ORAL_TABLET | Freq: Two times a day (BID) | ORAL | Status: DC
  Administered 2022-03-01 – 2022-03-03 (×5): 2 mg via ORAL
  Filled 2022-03-01 (×5): qty 2

## 2022-03-01 MED ORDER — ZIPRASIDONE MESYLATE 20 MG IM SOLR
20.0000 mg | Freq: Once | INTRAMUSCULAR | Status: AC
  Administered 2022-03-01: 20 mg via INTRAMUSCULAR
  Filled 2022-03-01: qty 20

## 2022-03-01 MED ORDER — DIPHENHYDRAMINE HCL 25 MG PO CAPS
25.0000 mg | ORAL_CAPSULE | Freq: Four times a day (QID) | ORAL | Status: DC | PRN
  Administered 2022-03-01: 25 mg via ORAL
  Filled 2022-03-01: qty 1

## 2022-03-01 NOTE — BH Assessment (Signed)
Per Idelia Salm, RN, pt is medically cleared, just received Geodon and is currently sleeping. Clinician asked the RN if she can notify her when the pt is awake and able to engage in TTS assessment.    Vertell Novak, [place], [facility], [facility] Triage Specialist [phone]

## 2022-03-01 NOTE — BH Assessment (Addendum)
Clinician messaged Idelia Salm, RN: "Hey. It's Trey with TTS. Is the pt able to engage in the assessment, if so the pt will need to be placed in a private room. Can you fax the pt's IVC to [phone]? Also is the pt medically cleared?"  Clinician awaiting response.    Vertell Novak, [place], [facility], [facility] Triage Specialist [phone]

## 2022-03-01 NOTE — ED Provider Notes (Addendum)
Emergency Medicine Observation Re-evaluation Note  Kiara [name] is a 22 y.o. female, seen on rounds today.  Pt initially presented to the ED for complaints of IVC Currently, the patient is IVC need and awaiting placement.  Patient's been yelling out randomly.  Emotions have been somewhat labile.  Patient has been able to be redirected.Marland Kitchen  Physical Exam  BP 117/71 (BP Location: Right Arm)   Pulse 79   Temp 99 F (37.2 C) (Oral)   Resp 18   Ht 1.6 m (5' 3"$ )   Wt 61 kg   SpO2 100%   BMI 23.82 kg/m  Physical Exam General: No acute distress nontoxic Cardiac:  Lungs: No respiratory distress Psych: Yelling out at times and somewhat labile with emotions.  ED Course / MDM  EKG:   I have reviewed the labs performed to date as well as medications administered while in observation.  Recent changes in the last 24 hours include nothing significant.Wilburn Mylar had potassium replaced orally.  Plan  Current plan is for placement.  [facility] recommending admission.    Fredia Sorrow, MD 03/01/22 1544    Fredia Sorrow, MD 03/01/22 [phone]
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
## 2022-03-01 NOTE — ED Notes (Signed)
Patient yelling out randomly. Patient labile with emotions and thoughts. Patient rambling at times or not answering questions at times. Patient disorganized. Patient redirected and reassured.

## 2022-03-02 DIAGNOSIS — F250 Schizoaffective disorder, bipolar type: Secondary | ICD-10-CM

## 2022-03-02 NOTE — Progress Notes (Addendum)
Pt was accepted to [facility] 03/03/22 PENDING Negative COVID-19; Bed Assignment [place]  Pt meets inpatient criteria per Charmaine Downs, NP   Attending Physician will be Dr. Mardelle Matte  Report can be called to: [phone] number, please leave a returned phone number to receive a phone call back.   Pt can arrive after 9:00am per pending item  Care Team notified: Charmaine Downs, NP, Pincus Large, RN, and [address] [place], [place] 03/02/2022 @ 8:40 PM

## 2022-03-02 NOTE — Progress Notes (Signed)
LCSW Progress Note  [id number]   Kemaya Empson  03/02/2022  2:21 PM  Description:   Inpatient Psychiatric Referral  Patient was recommended inpatient per Charmaine Downs, NP. There are no available beds at [facility]. Patient was referred to the following facilities:   Destination  Service Provider Address Phone Fax  [place] [place] [address] [phone]  [facility]  [address] [place] [address] [phone]  [place], [place] [id number] [phone]  [facility]  [place], [place] [address] [phone] [place]  [address], [place] [address] [phone]  [facility]  [place], [place] [address] [phone]  [facility]  [address] [place] [address] [phone] [place]  [place], [place] [address] [phone] [facility]  [id number] [place] [address] [place] [address] [phone] [facility]  [address] [place], [place] [address] [phone] [place] [place] [address] [place]  [facility]  [address] [place] [address] [phone]  [facility]  [address], [place] [address] [phone]  [place] [place] [address] [phone]  [facility]  [place] [address] [phone]  [facility]  [address], [place] [address] [phone] [facility]  [address], [place] [address] [phone] [facility]  [address] [place] [address] [phone]  [facility]  [address], [place] [address] [phone]  [facility]  [address] [place], [place] [address] [place] [facility] [place]  [facility]  [address], [place] [address] [phone]  [place]  [address] [place] [address] [phone]  [facility]  [address] [place] [address] [phone] [facility]  [address], [place] [address] [phone] [facility]  [address], [place] [address] [phone] [facility]  [address] [place] [address] [phone]    Situation ongoing, CSW to continue following and update chart as more information becomes available.      Denna Haggard, [place]  03/02/2022 2:21 PM

## 2022-03-02 NOTE — ED Provider Notes (Signed)
Emergency Medicine Observation Re-evaluation Note  Kiara [name] is a 22 y.o. female, seen on rounds today.  Pt initially presented to the ED for complaints of IVC Currently, the patient is resting.  Physical Exam  BP (!) 104/47 (BP Location: Left Arm)   Pulse 99   Temp 98.5 F (36.9 C) (Oral)   Resp 18   Ht 1.6 m (5' 3"$ )   Wt 61 kg   SpO2 100%   BMI 23.82 kg/m  Physical Exam General: Calm Cardiac: Regular rate Lungs: Normal effort Psych: Deferred  ED Course / MDM  EKG:   I have reviewed the labs performed to date as well as medications administered while in observation.  Recent changes in the last 24 hours include no acute events.  Plan  Current plan is for inpatient psychiatric treatment.    Dorie Rank, MD 03/02/22 [phone]
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
## 2022-03-02 NOTE — ED Notes (Signed)
Pt up and used restroom, did 10 pushups and got back in bed. denies needs and states she "just wants to go home."

## 2022-03-03 LAB — RESP PANEL BY RT-PCR (RSV, FLU A&B, COVID)  RVPGX2
Influenza A by PCR: NEGATIVE
Influenza B by PCR: NEGATIVE
Resp Syncytial Virus by PCR: NEGATIVE
SARS Coronavirus 2 by RT PCR: NEGATIVE

## 2022-03-03 NOTE — ED Notes (Signed)
Probation officer gave report to Rayburn Ma, Therapist, sports at [facility].

## 2022-03-03 NOTE — Discharge Instructions (Addendum)
Transfer to [facility],   dr. Juliann Mule accepting

## 2022-03-03 NOTE — ED Notes (Signed)
Called sheriff and left voicemail for transport.

## 2022-04-01 ENCOUNTER — Other Ambulatory Visit: Payer: Self-pay

## 2022-04-01 ENCOUNTER — Ambulatory Visit (HOSPITAL_COMMUNITY)
Admission: EM | Admit: 2022-04-01 | Discharge: 2022-04-01 | Disposition: A | Discharge status: Other Acute Inpatient Hospital | Payer: Medicaid Other | Attending: Psychiatry | Admitting: Psychiatry

## 2022-04-01 ENCOUNTER — Encounter (HOSPITAL_COMMUNITY): Payer: Self-pay | Admitting: Emergency Medicine

## 2022-04-01 ENCOUNTER — Emergency Department (HOSPITAL_COMMUNITY)
Admission: EM | Admit: 2022-04-01 | Discharge: 2022-04-03 | Disposition: A | Discharge status: Other Acute Inpatient Hospital | Payer: Medicaid Other | Attending: Emergency Medicine | Admitting: Emergency Medicine

## 2022-04-01 DIAGNOSIS — Z1152 Encounter for screening for COVID-19: Secondary | ICD-10-CM | POA: Insufficient documentation

## 2022-04-01 DIAGNOSIS — F250 Schizoaffective disorder, bipolar type: Secondary | ICD-10-CM | POA: Diagnosis present

## 2022-04-01 DIAGNOSIS — R45850 Homicidal ideations: Secondary | ICD-10-CM | POA: Insufficient documentation

## 2022-04-01 DIAGNOSIS — R4689 Other symptoms and signs involving appearance and behavior: Secondary | ICD-10-CM

## 2022-04-01 DIAGNOSIS — F17210 Nicotine dependence, cigarettes, uncomplicated: Secondary | ICD-10-CM | POA: Diagnosis not present

## 2022-04-01 DIAGNOSIS — R451 Restlessness and agitation: Secondary | ICD-10-CM

## 2022-04-01 HISTORY — DX: Schizophrenia, unspecified: F20.9

## 2022-04-01 LAB — CBC
HCT: 39.2 % (ref 36.0–46.0)
Hemoglobin: 13 g/dL (ref 12.0–15.0)
MCH: 30.4 pg (ref 26.0–34.0)
MCHC: 33.2 g/dL (ref 30.0–36.0)
MCV: 91.6 fL (ref 80.0–100.0)
Platelets: 263 10*3/uL (ref 150–400)
RBC: 4.28 MIL/uL (ref 3.87–5.11)
RDW: 14.6 % (ref 11.5–15.5)
WBC: 6.4 10*3/uL (ref 4.0–10.5)
nRBC: 0 % (ref 0.0–0.2)

## 2022-04-01 NOTE — ED Notes (Signed)
Pt refused vitals 

## 2022-04-01 NOTE — Progress Notes (Signed)
Patient presented to [place] in the custody of [employer].  Patient was screaming, making threats and unable to process any interaction with staff.  Cove recliners are full with no where to hold the patient.

## 2022-04-01 NOTE — ED Triage Notes (Signed)
Patient refused blood specimen collection while at triage .
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
## 2022-04-01 NOTE — ED Triage Notes (Signed)
Pt presents to [facility] under IVC, accompanied by [employer]. Pt was unable to be assessed/triaged due to current condition. Pt is irate, yelling, screaming, cursing and not cooperative at this time. Pt refused vitals.
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
## 2022-04-02 DIAGNOSIS — F250 Schizoaffective disorder, bipolar type: Secondary | ICD-10-CM

## 2022-04-02 LAB — URINALYSIS, ROUTINE W REFLEX MICROSCOPIC
Bilirubin Urine: NEGATIVE
Glucose, UA: NEGATIVE mg/dL
Hgb urine dipstick: NEGATIVE
Ketones, ur: NEGATIVE mg/dL
Nitrite: NEGATIVE
Protein, ur: NEGATIVE mg/dL
Specific Gravity, Urine: 1.008 (ref 1.005–1.030)
pH: 7 (ref 5.0–8.0)

## 2022-04-02 LAB — COMPREHENSIVE METABOLIC PANEL
ALT: 21 U/L (ref 0–44)
AST: 24 U/L (ref 15–41)
Albumin: 3.9 g/dL (ref 3.5–5.0)
Alkaline Phosphatase: 51 U/L (ref 38–126)
Anion gap: 7 (ref 5–15)
BUN: 10 mg/dL (ref 6–20)
CO2: 23 mmol/L (ref 22–32)
Calcium: 9.3 mg/dL (ref 8.9–10.3)
Chloride: 104 mmol/L (ref 98–111)
Creatinine, Ser: 0.62 mg/dL (ref 0.44–1.00)
GFR, Estimated: >60 mL/min (ref >60)
Glucose, Bld: 83 mg/dL (ref 70–99)
Potassium: 3.6 mmol/L (ref 3.5–5.1)
Sodium: 134 mmol/L — ABNORMAL LOW (ref 135–145)
Total Bilirubin: 0.5 mg/dL (ref 0.3–1.2)
Total Protein: 7.2 g/dL (ref 6.5–8.1)

## 2022-04-02 LAB — RAPID URINE DRUG SCREEN, HOSP PERFORMED
Amphetamines: NOT DETECTED
Barbiturates: NOT DETECTED
Benzodiazepines: NOT DETECTED
Cocaine: NOT DETECTED
Opiates: NOT DETECTED
Tetrahydrocannabinol: POSITIVE — AB

## 2022-04-02 LAB — ETHANOL: Alcohol, Ethyl (B): <10 mg/dL (ref <10)

## 2022-04-02 LAB — I-STAT BETA HCG BLOOD, ED (MC, WL, AP ONLY): I-stat hCG, quantitative: <5 m[IU]/mL (ref <5)

## 2022-04-02 LAB — VALPROIC ACID LEVEL: Valproic Acid Lvl: <10 ug/mL — ABNORMAL LOW (ref 50.0–100.0)

## 2022-04-02 MED ORDER — LORAZEPAM 1 MG PO TABS: 1.0000 mg | ORAL_TABLET | ORAL | Status: DC | PRN

## 2022-04-02 MED ORDER — ZIPRASIDONE MESYLATE 20 MG IM SOLR: 20.0000 mg | INTRAMUSCULAR | Status: DC | PRN

## 2022-04-02 MED ORDER — ZIPRASIDONE MESYLATE 20 MG IM SOLR
20.0000 mg | Freq: Once | INTRAMUSCULAR | Status: AC
  Administered 2022-04-02: 20 mg via INTRAMUSCULAR
  Filled 2022-04-02: qty 20

## 2022-04-02 MED ORDER — OLANZAPINE 5 MG PO TBDP
5.0000 mg | ORAL_TABLET | Freq: Three times a day (TID) | ORAL | Status: DC | PRN
  Administered 2022-04-03: 5 mg via ORAL
  Filled 2022-04-02: qty 1

## 2022-04-02 NOTE — ED Notes (Signed)
Pt's breakfast has arrived, pt sitting up and watching tv while eating her breakfast

## 2022-04-02 NOTE — ED Notes (Signed)
Snake was given to patient by RN.

## 2022-04-02 NOTE — ED Notes (Addendum)
Pt sleeping at this time. Equal chest rise and fall noted.

## 2022-04-02 NOTE — ED Notes (Signed)
Pt. Came in Ivc'd 04/02/22 exp. 04/09/22. Received paperwork from [employer]. First exam completed. 3 copies on clipboard, copy in red folder and medical records

## 2022-04-02 NOTE — ED Notes (Signed)
Pt took off her orange hoodie. Writer took orange hoodie and placed it into the locker with her other belongings. Placed into locker #4, purple zone

## 2022-04-02 NOTE — ED Notes (Addendum)
Verbal deescalating unsuccessful. PRN Geodon to be administered. Security called for standby.

## 2022-04-02 NOTE — ED Notes (Signed)
Pt requested a snack after her shower, Probation officer provided pt with one. Pt became agitated with writer because 'one of her snacks' was missing. Writer told pt that she cannot have more than 2 snacks per snack time. Pt back in her room, eating her snacks.

## 2022-04-02 NOTE — ED Notes (Signed)
PT ambulated to the restroom and back to bed without incident. Pt asleep again at this time. Calm and cooperative.

## 2022-04-02 NOTE — Progress Notes (Signed)
LCSW Progress Note  [id number]   Kiara [name]  04/02/2022  1:55 PM  Description:   Inpatient Psychiatric Referral  Patient was recommended inpatient per Darrol Angel, NP. There are no available beds at [facility]. Patient was referred to the following facilities:   Destination  Service Provider Address Phone Fax  [facility]  [address] [place] [address] [phone]  [place], [place] [phone]  [place]  [address] [place], [place] [address] [phone]  [facility] [place] [address] [phone]  [facility]  [address] [place] [address] [phone] [facility]  [address] [place], [place] [address] [phone] [place] [address] [place]  [facility]  [address] [place] [address] [phone]  [facility]  [address], [place] [address] [phone]  [place] [address] [place] [address] [phone]  [facility]  [address], [place] [address] [phone] [place]  [address] [place] [address] [phone] [facility]  [address] [place] [address] [phone] [facility]  [address] [place] [address] [place]  [address], [place] [address] [phone] [facility]  [address] [place] [address] [phone] [facility]  [address] [place] [address] [phone]  [facility]  [address] [place], [place] [address] [phone]  [facility]  [address] [place] [address] [phone] [place]  [address] [place] [address] [phone]  [address] [place] [address] [id number]  [facility]  [address] [place] [address] [phone] [place]  [address], [place] [address] [phone]  [facility]  [place], [place] [address] [phone]  [facility]  [place], [place] [address] [phone] [facility]  [address] [place] [address] [phone]    Situation ongoing, CSW to continue following and update chart as more information becomes available.      Denna Haggard, Latanya Presser  04/02/2022 1:55 PM
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
## 2022-04-02 NOTE — ED Notes (Signed)
Patient requesting to leave redirected to speak to RN. Patient doing push-ups in the middle of floor was redirected back to room.

## 2022-04-02 NOTE — ED Notes (Signed)
Pt walked to [facility] room 51.

## 2022-04-02 NOTE — ED Notes (Signed)
Pt requesting to take a shower, shower supplies given to pt. Pt in shower now. Pt made aware of shower rules

## 2022-04-02 NOTE — ED Notes (Addendum)
PD at bedside. Pt cooperative at this time, sitting on bed, though clearly responding to internal/external stimuli. Pt provided food and drink.

## 2022-04-02 NOTE — ED Notes (Signed)
Pt walking outside of their room to the hallway, walking into the bathroom and back to her room. Pt will stand at the mirror and pull her pants down, when asked if she needs anything/if in pain, pt does not respond. Pt back in her room, pacing back and forth in her room.

## 2022-04-02 NOTE — ED Notes (Signed)
Pt's breakfast has not been ordered, Probation officer brought a sandwich bag and apple juice for pt. RN notified, will order a breakfast tray for pt, once diet order is present
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
## 2022-04-02 NOTE — ED Notes (Signed)
Psychiatry in room

## 2022-04-02 NOTE — ED Notes (Signed)
Sitter at bedside.

## 2022-04-02 NOTE — ED Notes (Signed)
Pt is sleeping at this time. Equal chest rise and fall noted.

## 2022-04-02 NOTE — ED Notes (Signed)
Pt's dinner has arrived, Probation officer brought it into her room. Writer asked pt if she would like anything to drink, pt did not respond and began doing push-ups

## 2022-04-02 NOTE — ED Notes (Signed)
Pt's lunch has arrived, pt sitting up and eating her lunch now.
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
## 2022-04-02 NOTE — Progress Notes (Signed)
Pt was accepted to [facility] 04/03/2022, pending IVC paperwork faxed to [phone]. Bed assignment: Main campus  Pt meets inpatient criteria per Darrol Angel, NP  Attending Physician will be Leta Jungling, MD  Report can be called to: [phone] (this is a pager, please leave call-back number when giving report)  Pt can arrive after 8 AM  Care Team Notified: Darrol Angel, NP, Trisha Mangle, RN, Purcell Mouton, RN, and [address] LCSWA  [place], [place]  04/02/2022 2:57 PM

## 2022-04-02 NOTE — ED Notes (Signed)
Provided pt with sandwich bag, peanut butter, crackers, and apple juice for snack.

## 2022-04-03 LAB — SARS CORONAVIRUS 2 BY RT PCR: SARS Coronavirus 2 by RT PCR: NEGATIVE

## 2022-04-03 NOTE — ED Notes (Signed)
3rd attempt: Paged [facility] at [phone] requesting a return call to give report on pt.

## 2022-04-03 NOTE — ED Notes (Signed)
Pt w/ outburst upon [facility] transporting pt.

## 2022-04-03 NOTE — ED Notes (Signed)
Report given to [facility] dispatch.

## 2022-04-03 NOTE — ED Notes (Signed)
Pt transported via [facility] to [facility]. All belongings and paperwork given to [facility]. Pt VSS and alert and ambulatory w/ steady gait upon D/C

## 2022-04-03 NOTE — ED Notes (Signed)
Pt noted to have multiple repeated bursts of laughter, apparently responding to internal stimuli. No aggressive behavior noted at this time.

## 2022-04-03 NOTE — ED Notes (Signed)
Paged [facility] at [phone] requesting a return call to give report on pt.

## 2022-04-03 NOTE — ED Notes (Signed)
Pt calm and cooperative, but noticeably responding to internal stimuli.

## 2022-04-03 NOTE — ED Notes (Signed)
Pt walked to bathroom w/o incident. Pt then walked back to her room w/o incident.

## 2022-04-03 NOTE — ED Notes (Signed)
2nd attempt: Paged [facility] at [phone] requesting a return call to give report on pt.

## 2022-04-03 NOTE — ED Notes (Addendum)
Pt redirected back to her room. Pt keeps coming out of room and pt still responding to internal stimuli w/ suspicious behaviors.

## 2022-04-03 NOTE — ED Notes (Signed)
4th attempt: Paged [facility] at [phone] requesting a return call to give report on pt.

## 2022-04-03 NOTE — ED Notes (Signed)
PRN zyprexa given d/t escalating behaviors.

## 2022-04-03 NOTE — ED Provider Notes (Signed)
Patient accepted to [facility]. Is stable for transfer.    Sherwood Gambler, MD 04/03/22 1046

## 2022-04-03 NOTE — ED Notes (Signed)
Pt came to nurses station requesting socks. Staff provided pt w/ a new pair of socks. Pt went back to her room. Pt w/ flat affect, responding to internal stimuli

## 2022-04-21 ENCOUNTER — Encounter (HOSPITAL_COMMUNITY): Payer: Self-pay

## 2022-04-22 ENCOUNTER — Ambulatory Visit (HOSPITAL_COMMUNITY): Payer: Medicaid Other | Admitting: Psychiatry

## 2022-05-21 ENCOUNTER — Ambulatory Visit (HOSPITAL_COMMUNITY): Payer: Medicaid Other | Admitting: Licensed Clinical Social Worker

## 2022-05-21 ENCOUNTER — Telehealth (HOSPITAL_COMMUNITY): Payer: Self-pay | Admitting: Licensed Clinical Social Worker

## 2022-05-21 ENCOUNTER — Encounter (HOSPITAL_COMMUNITY): Payer: Self-pay

## 2022-05-21 NOTE — Telephone Encounter (Signed)
LCSW sent two links to pt phone number in epic. LCSW f/u with PC and message received was phone number not in service

## 2023-01-05 ENCOUNTER — Other Ambulatory Visit: Payer: Self-pay

## 2023-01-05 ENCOUNTER — Emergency Department (HOSPITAL_COMMUNITY)
Admission: EM | Admit: 2023-01-05 | Discharge: 2023-01-06 | Disposition: A | Discharge status: Other Acute Inpatient Hospital | Payer: MEDICAID | Attending: Emergency Medicine | Admitting: Emergency Medicine

## 2023-01-05 DIAGNOSIS — F1210 Cannabis abuse, uncomplicated: Secondary | ICD-10-CM | POA: Insufficient documentation

## 2023-01-05 DIAGNOSIS — F259 Schizoaffective disorder, unspecified: Secondary | ICD-10-CM | POA: Insufficient documentation

## 2023-01-05 DIAGNOSIS — F250 Schizoaffective disorder, bipolar type: Secondary | ICD-10-CM | POA: Diagnosis present

## 2023-01-05 DIAGNOSIS — F209 Schizophrenia, unspecified: Secondary | ICD-10-CM

## 2023-01-05 DIAGNOSIS — F17210 Nicotine dependence, cigarettes, uncomplicated: Secondary | ICD-10-CM | POA: Insufficient documentation

## 2023-01-05 DIAGNOSIS — F22 Delusional disorders: Secondary | ICD-10-CM | POA: Insufficient documentation

## 2023-01-05 DIAGNOSIS — Z20822 Contact with and (suspected) exposure to covid-19: Secondary | ICD-10-CM | POA: Diagnosis not present

## 2023-01-05 DIAGNOSIS — Y900 Blood alcohol level of less than 20 mg/100 ml: Secondary | ICD-10-CM | POA: Diagnosis not present

## 2023-01-05 LAB — SALICYLATE LEVEL: Salicylate Lvl: <7 mg/dL — ABNORMAL LOW (ref 7.0–30.0)

## 2023-01-05 LAB — RAPID URINE DRUG SCREEN, HOSP PERFORMED
Amphetamines: NOT DETECTED
Barbiturates: NOT DETECTED
Benzodiazepines: NOT DETECTED
Cocaine: NOT DETECTED
Opiates: NOT DETECTED
Tetrahydrocannabinol: POSITIVE — AB

## 2023-01-05 LAB — CBC WITH DIFFERENTIAL/PLATELET
Abs Immature Granulocytes: 0.02 10*3/uL (ref 0.00–0.07)
Basophils Absolute: 0 10*3/uL (ref 0.0–0.1)
Basophils Relative: 0 %
Eosinophils Absolute: 0.1 10*3/uL (ref 0.0–0.5)
Eosinophils Relative: 2 %
HCT: 41.7 % (ref 36.0–46.0)
Hemoglobin: 13.7 g/dL (ref 12.0–15.0)
Immature Granulocytes: 0 %
Lymphocytes Relative: 45 %
Lymphs Abs: 3.2 10*3/uL (ref 0.7–4.0)
MCH: 31.6 pg (ref 26.0–34.0)
MCHC: 32.9 g/dL (ref 30.0–36.0)
MCV: 96.3 fL (ref 80.0–100.0)
Monocytes Absolute: 1.1 10*3/uL — ABNORMAL HIGH (ref 0.1–1.0)
Monocytes Relative: 16 %
Neutro Abs: 2.6 10*3/uL (ref 1.7–7.7)
Neutrophils Relative %: 37 %
Platelets: 224 10*3/uL (ref 150–400)
RBC: 4.33 MIL/uL (ref 3.87–5.11)
RDW: 14 % (ref 11.5–15.5)
WBC: 7.1 10*3/uL (ref 4.0–10.5)
nRBC: 0 % (ref 0.0–0.2)

## 2023-01-05 LAB — COMPREHENSIVE METABOLIC PANEL
ALT: 17 U/L (ref 0–44)
AST: 26 U/L (ref 15–41)
Albumin: 4 g/dL (ref 3.5–5.0)
Alkaline Phosphatase: 48 U/L (ref 38–126)
Anion gap: 8 (ref 5–15)
BUN: 8 mg/dL (ref 6–20)
CO2: 22 mmol/L (ref 22–32)
Calcium: 9 mg/dL (ref 8.9–10.3)
Chloride: 109 mmol/L (ref 98–111)
Creatinine, Ser: 0.5 mg/dL (ref 0.44–1.00)
GFR, Estimated: >60 mL/min (ref >60)
Glucose, Bld: 90 mg/dL (ref 70–99)
Potassium: 3.3 mmol/L — ABNORMAL LOW (ref 3.5–5.1)
Sodium: 139 mmol/L (ref 135–145)
Total Bilirubin: 0.9 mg/dL (ref <1.2)
Total Protein: 6.9 g/dL (ref 6.5–8.1)

## 2023-01-05 LAB — RESP PANEL BY RT-PCR (RSV, FLU A&B, COVID)  RVPGX2
Influenza A by PCR: NEGATIVE
Influenza B by PCR: NEGATIVE
Resp Syncytial Virus by PCR: NEGATIVE
SARS Coronavirus 2 by RT PCR: NEGATIVE

## 2023-01-05 LAB — HCG, SERUM, QUALITATIVE: Preg, Serum: NEGATIVE

## 2023-01-05 LAB — ACETAMINOPHEN LEVEL: Acetaminophen (Tylenol), Serum: <10 ug/mL — ABNORMAL LOW (ref 10–30)

## 2023-01-05 LAB — ETHANOL: Alcohol, Ethyl (B): <10 mg/dL (ref <10)

## 2023-01-05 MED ORDER — POTASSIUM CHLORIDE CRYS ER 20 MEQ PO TBCR
40.0000 meq | EXTENDED_RELEASE_TABLET | Freq: Once | ORAL | Status: AC
  Administered 2023-01-06: 40 meq via ORAL
  Filled 2023-01-05: qty 2

## 2023-01-05 MED ORDER — DROPERIDOL 2.5 MG/ML IJ SOLN
5.0000 mg | Freq: Once | INTRAMUSCULAR | Status: AC
  Administered 2023-01-05: 5 mg via INTRAMUSCULAR
  Filled 2023-01-05: qty 2

## 2023-01-05 NOTE — ED Provider Notes (Signed)
[place] EMERGENCY DEPARTMENT AT [facility] Provider Note   CSN: [id number] Arrival date & time: 01/05/23  1706     History  Chief Complaint  Patient presents with   IVC    Kiara [name] is a 22 y.o. female.  22 year old female with history of schizophrenia who presents emergency department with [facility] police after family reports over the last several days patient has been exhibiting erratic behavior, wandering around outside without appropriate clothing or shoes.  Upon arrival to the emergency department, patient screaming.  Level 5 chart caveat for psychiatric patient presentation.        Home Medications Prior to Admission medications   Medication Sig Start Date End Date Taking? Authorizing Provider  ibuprofen (ADVIL) 600 MG tablet Take 1 tablet (600 mg total) by mouth every 6 (six) hours as needed for headache, mild pain, moderate pain or cramping. Patient not taking: Reported on 01/05/2023 12/12/21   Warden Fillers, MD  oxyCODONE (OXY IR/ROXICODONE) 5 MG immediate release tablet Take 1 tablet (5 mg total) by mouth every 6 (six) hours as needed for severe pain or breakthrough pain. Patient not taking: Reported on 01/05/2023 12/12/21   Warden Fillers, MD      Allergies    Patient has no known allergies.    Review of Systems   Review of Systems  Physical Exam Updated Vital Signs BP (!) 127/96   Pulse 87   Temp 98.5 F (36.9 C) (Oral)   Resp 16   Ht 5\' 3"  (1.6 m)   Wt 54.4 kg   LMP 12/15/2022   SpO2 100%   BMI 21.26 kg/m  Physical Exam Vitals reviewed.  HENT:     Head: Normocephalic.  Cardiovascular:     Pulses: Normal pulses.  Abdominal:     General: Abdomen is flat.     Palpations: Abdomen is soft.  Musculoskeletal:        General: No swelling or deformity. Normal range of motion.     Cervical back: Normal range of motion.  Skin:    General: Skin is warm and dry.  Neurological:     General: No focal deficit present.      Gait: Gait normal.  Psychiatric:     Comments: Patient does appear to be responding to internal stimuli.  She denies any SI or HI.     ED Results / Procedures / Treatments   Labs (all labs ordered are listed, but only abnormal results are displayed) Labs Reviewed  RAPID URINE DRUG SCREEN, HOSP PERFORMED - Abnormal; Notable for the following components:      Result Value   Tetrahydrocannabinol POSITIVE (*)    All other components within normal limits  CBC WITH DIFFERENTIAL/PLATELET - Abnormal; Notable for the following components:   Monocytes Absolute 1.1 (*)    All other components within normal limits  ACETAMINOPHEN LEVEL - Abnormal; Notable for the following components:   Acetaminophen (Tylenol), Serum <10 (*)    All other components within normal limits  SALICYLATE LEVEL - Abnormal; Notable for the following components:   Salicylate Lvl <7.0 (*)    All other components within normal limits  COMPREHENSIVE METABOLIC PANEL - Abnormal; Notable for the following components:   Potassium 3.3 (*)    All other components within normal limits  RESP PANEL BY RT-PCR (RSV, FLU A&B, COVID)  RVPGX2  ETHANOL  HCG, SERUM, QUALITATIVE    EKG None  Radiology No results found.  Procedures Procedures    Medications  Ordered in ED Medications  potassium chloride SA (KLOR-CON M) CR tablet 40 mEq (has no administration in time range)  droperidol (INAPSINE) 2.5 MG/ML injection 5 mg (5 mg Intramuscular Given 01/05/23 1744)    ED Course/ Medical Decision Making/ A&P                                 Medical Decision Making 22 year old female here today for erratic behavior.  Differential diagnoses include schizophrenia, medication noncompliance, polysubstance use.  Plan-reviewed patient's prior ED notes, hospital admission notes.  Patient has a history of schizophrenia, based on her initial exam, do believe she is presenting with acute psychosis.  With the patient currently wandering  around outside during discharge for cold weather, do believe this represents a clear danger to herself.  An IVC was filled out by [facility] police, I filled out the first medical examination.  Will medically clear the patient, will refer to TTS.  No injuries identified on physical exam.  Normal neurological exam.  Reassessment 11:05 PM-patient with a slightly low potassium, will provide oral potassium.  Labs otherwise unremarkable.  Patient is now medically cleared.  She is appropriate for TTS consultation.  Patient with no home medications to order at this time.    Amount and/or Complexity of Data Reviewed Labs: ordered.  Risk Prescription drug management.           Final Clinical Impression(s) / ED Diagnoses Final diagnoses:  Schizophrenia, unspecified type [facility])    Rx / DC Orders ED Discharge Orders     None         Arletha Pili, DO 01/05/23 2307

## 2023-01-05 NOTE — ED Notes (Signed)
Patient jumping on bed and screaming unknown verbiage. RN and other staff helped patient down from standing on the bed.

## 2023-01-05 NOTE — ED Notes (Signed)
Pt refusing to let staff draw additional blood work. Pt yelling at staff and refusing to cooperate.

## 2023-01-05 NOTE — ED Triage Notes (Signed)
Patient brought in by [employer] from home for IVC. Per [facility] patient father called to have patient transported and evaluated. She denies SI/HI.

## 2023-01-05 NOTE — ED Notes (Addendum)
RN had to talk patient off the bed patient upset jumping off bed taking off equipment she also refused extra blood draw and urine at this time, MD made aware. Lab called to see if other labs can be ran off blood already sent.

## 2023-01-05 NOTE — ED Notes (Addendum)
Pt. Was very agitated jumped up on the bed and started yelling after medication was given. Pt. Was able to walk to the bathroom and provide urine specimen. Pt repeatedly keeps laying down and getting back up and standing on the bed or trying to come out of the room. Pt. Is informed each time that she needs to go ahead and lay back in bed. Pt. Is in bed at the moment in time. Pt. Also states "I am not a rapist"

## 2023-01-05 NOTE — ED Notes (Signed)
Pt. Is resting comfortably at this time.

## 2023-01-06 DIAGNOSIS — F29 Unspecified psychosis not due to a substance or known physiological condition: Secondary | ICD-10-CM

## 2023-01-06 DIAGNOSIS — F250 Schizoaffective disorder, bipolar type: Secondary | ICD-10-CM

## 2023-01-06 NOTE — Progress Notes (Signed)
Pt has been accepted to [facility] for today 01/06/2023  Bed assignment: Main campus  Pt meets inpatient criteria per: Phebe Colla NP  Attending Physician will be Loni Beckwith, MD  Report can be called to: [phone] (this is a pager, please leave call-back number when giving report)  Pt can arrive ASAP today   Care Team Notified:  Jacquelynn Cree NT, Phebe Colla NP, Suzanna Obey Paramedic  [place] Donie Lemelin LCSW-A   01/06/2023 12:52 PM

## 2023-01-06 NOTE — ED Notes (Signed)
Writer contacted [facility] to inform them transport may not be available today. Writer spoke with Mr. Kiara [name] who advised he would make a note in the system to hold pt's room

## 2023-01-06 NOTE — Consult Note (Signed)
[facility] Psychiatric Consult Initial  Patient Name: .Kiara [name]  MRN: [id number]  DOB: 14-Mar-2000  Consult Order details:  Orders (From admission, onward)     Start     Ordered   01/05/23 2305  CONSULT TO CALL ACT TEAM       Ordering Provider: Arletha Pili, DO  Provider:  (Not yet assigned)  Question:  Reason for Consult?  Answer:  psychosis   01/05/23 2305             Mode of Visit: In person    Psychiatry Consult Evaluation  Service Date: January 06, 2023 LOS:  LOS: 0 days  Chief Complaint "My dad brought me in because I was crazy praising God"  Primary Psychiatric Diagnoses  Schizoaffective disorder  Assessment  Kiara [name] is a 22 y.o. female admitted: Presented to the ED for 01/05/2023  5:06 PM for "several days of erratic behavior." She carries the psychiatric diagnoses of schizoaffective disorder bipolar type and has a past medical history of  tubo-ovarian abcess.   Her current presentation of psychosis is most consistent with schizoaffectve disorder. She meets criteria for schizoaffectve disorder bipolar type based on her manic behaviors and hallucinations.  Current outpatient psychotropic medications include None. Previously, patient has taken zyprexa and haldol and historically she denies a positive response to these medications. She was not compliant with medications prior to admission as evidenced by self report that she is not taking any medication. On initial examination, patient is found to be responding to internal stimuli and unable to sit still. Please see plan below for detailed recommendations.   Diagnoses:  Active Hospital problems: Principal Problem:   Schizoaffective disorder, bipolar type (HCC)    Plan   ## Psychiatric Medication Recommendations:  Risperdal 1mg  PO BID   ## Medical Decision Making Capacity: Not specifically addressed in this encounter  ## Further Work-up:  -- most recent EKG on 01/05/2023 had QtC of 421 --  Pertinent labwork reviewed earlier this admission includes: CBC, CMP, pregnancy, alcohol, and UTOX   ## Disposition:-- We recommend inpatient psychiatric hospitalization after medical hospitalization. Patient has been involuntarily committed on 01/05/2023 at 3:47pm.   ## Behavioral / Environmental: -Difficult Patient (SELECT OPTIONS FROM BELOW) or Utilize compassion and acknowledge the patient's experiences while setting clear and realistic expectations for care.    ## Safety and Observation Level:  - Based on my clinical evaluation, I estimate the patient to be at low risk of self harm in the current setting. - At this time, we recommend  routine. This decision is based on my review of the chart including patient's history and current presentation, interview of the patient, mental status examination, and consideration of suicide risk including evaluating suicidal ideation, plan, intent, suicidal or self-harm behaviors, risk factors, and protective factors. This judgment is based on our ability to directly address suicide risk, implement suicide prevention strategies, and develop a safety plan while the patient is in the clinical setting. Please contact our team if there is a concern that risk level has changed.  CSSR Risk Category:C-SSRS RISK CATEGORY: No Risk  Suicide Risk Assessment: Patient has following modifiable risk factors for suicide: medication noncompliance, which we are addressing by recommending inpaitent psychiatric hospitalization for stabilization, medication management and treatment. Patient has following non-modifiable or demographic risk factors for suicide: psychiatric hospitalization Patient has the following protective factors against suicide: Cultural, spiritual, or religious beliefs that discourage suicide  Thank you for this consult request. Recommendations have been communicated to  the primary team.  We will continue to follow at this time.   Thomes Lolling, NP        History of Present Illness  Relevant Aspects of Hospital ED Course:  Kiara [name] is a 22 y.o. female admitted: Presented to the ED for 01/05/2023  5:06 PM for "several days of erratic behavior". She carries the psychiatric diagnoses of schizoaffective disorder bipolar type and has a past medical history of  tubo-ovarian abcess.   Patient Report:  Patient is seen initially standing in her room.  She states she is in the emergency room because "My dad brought me in because I was crazy praising God".  Patient reports she likes to jump, yell, run around and shout praise to God.  Patient refers to God with feminine pronouns when she said "I can tell you love God.  You love her so much."  The patient transitioned from God to sports stating "I love to play basketball, baseball, football..." She named off several sports and then said "sometimes I play invisible."  Patient is very distracted throughout interview and responding to unseen others. Her speech is pressured and she laughs inappropriately during interview.  Patient is unable to remain still during interview transitioning between sitting and standing and squirming in her seat while seated.  She endorsed visual hallucinations, but was noncommittal regarding auditory hallucinations; she denies suicidal ideation and homicidal ideation.  Patient endorses daily THC use and states she "sometimes will take xanax to mellow out from her street prescriber".  Patient states that she is on disability.  Patient asked provider if the provider has ever used the morning after pill. Patient then stated "the morning after pill tastes good and feels good too".    Psych ROS:  Depression: denies Anxiety:  denies Mania (lifetime and current): Previous and current Psychosis: (lifetime and current): Previous and current  Collateral information:  Attempted to contact father, Nedda Fripp, [phone] without success.  HIPAA compliant message left.   Review of  Systems  Skin:  Positive for itching.       Dry itchy arms  Psychiatric/Behavioral:  Positive for hallucinations and substance abuse.   All other systems reviewed and are negative.    Psychiatric and Social History  Psychiatric History:  Information collected from Patient   Prev Dx/Sx: Schizoaffective disorder bipolar type Current Psych Provider: None Home Meds (current): None Previous Med Trials: Zyprexa and Haldol Therapy: None  Prior Psych Hospitalization: Yes  Prior Self Harm: Endorses - more than a year ago Prior Violence: denies  Family Psych History: Brother - Anxiety Family Hx suicide: Denies  Social History:  Developmental Hx: None Educational Hx: HS diploma Occupational Hx: disability Legal Hx: Denies Living Situation: Unclear Spiritual Hx: Verbalizes a belief in God Access to weapons/lethal means: Denies   Substance History Alcohol: Denies  Tobacco: Denies Illicit drugs: Daily THC use Prescription drug abuse: Occasional Xanax "from street provider."   Rehab hx: Denies  Exam Findings   Vital Signs:  Temp:  [98.3 F (36.8 C)-99.1 F (37.3 C)] 98.3 F (36.8 C) (12/25 0641) Pulse Rate:  [62-87] 87 (12/24 2240) Resp:  [14-26] 14 (12/25 0641) BP: (90-135)/(56-96) 127/96 (12/24 2240) SpO2:  [100 %] 100 % (12/24 2240) Weight:  [54.4 kg] 54.4 kg (12/24 1724) Blood pressure (!) 127/96, pulse 87, temperature 98.3 F (36.8 C), temperature source Oral, resp. rate 14, height 5\' 3"  (1.6 m), weight 54.4 kg, last menstrual period 12/15/2022, SpO2 100%. Body mass index is 21.26 kg/m.  Physical Exam Vitals and nursing note reviewed.  Eyes:     Pupils: Pupils are equal, round, and reactive to light.  Pulmonary:     Effort: Pulmonary effort is normal.  Skin:    General: Skin is dry.  Neurological:     Mental Status: She is alert and oriented to person, place, and time.  Psychiatric:        Attention and Perception: She perceives visual hallucinations.         Speech: Speech is rapid and pressured.        Behavior: Behavior is cooperative.        Thought Content: Thought content is delusional.        Judgment: Judgment is impulsive and inappropriate.     Mental Status Exam: General Appearance: Negative  Orientation:  Full (Time, Place, and Person)  Memory:  Immediate;   Good Recent;   Good Remote;   Good  Concentration:  Concentration: Poor  Recall:  Good  Attention  Poor  Eye Contact:  Good  Speech:  Pressured  Language:  Fair  Volume:  Increased  Mood: anxious  Affect:  Congruent  Thought Process:  Disorganized  Thought Content:  Delusions and Hallucinations: Auditory Visual  Suicidal Thoughts:  No  Homicidal Thoughts:  No  Judgement:  Impaired  Insight:  Lacking  Psychomotor Activity:  Increased and Restlessness  Akathisia:  No  Fund of Knowledge:  Fair      Assets:  Health and safety inspector Social Support  Cognition:  WNL  ADL's:  Intact  AIMS (if indicated):        Other History   These have been pulled in through the EMR, reviewed, and updated if appropriate.  Family History:  The patient's family history is not on file.  Medical History: Past Medical History:  Diagnosis Date   Hidradenitis suppurativa of multiple sites    Schizophrenia [facility])     Surgical History: No past surgical history on file.   Medications:  No current facility-administered medications for this encounter.  Current Outpatient Medications:    ibuprofen (ADVIL) 600 MG tablet, Take 1 tablet (600 mg total) by mouth every 6 (six) hours as needed for headache, mild pain, moderate pain or cramping. (Patient not taking: Reported on 01/05/2023), Disp: 30 tablet, Rfl: 2   oxyCODONE (OXY IR/ROXICODONE) 5 MG immediate release tablet, Take 1 tablet (5 mg total) by mouth every 6 (six) hours as needed for severe pain or breakthrough pain. (Patient not taking: Reported on 01/05/2023), Disp: 6 tablet, Rfl: 0  Allergies: No Known  Allergies  Thomes Lolling, NP

## 2023-01-06 NOTE — BH Assessment (Signed)
Clinician spoke to IRIS to complete TTS assessment. Clinician provided MRN, location, age, provider name and room number. Secure message completed.   Iris Coordinator reports, pt can be assessed at 0045.  Redmond Pulling, MS, [facility], [facility] Triage Specialist [phone]

## 2023-01-06 NOTE — ED Notes (Signed)
Writer contacted sheriff and left message to arrange transport for pt

## 2023-01-06 NOTE — Progress Notes (Signed)
LCSW Progress Note  [id number]   Sai Ausmus  01/06/2023  12:06 PM  Description:   Inpatient Psychiatric Referral  Patient was recommended inpatient per Phebe Colla NP There are no available beds at [facility], per [facility] RN. Patient was referred to the following out of network facilities:    [facility] Provider Address Phone Fax  [facility] [address], [place] [address] [phone]  [facility] Patient Placement [facility], [place] [phone]  [facility] [address] [place] [address] [phone]  [facility] [address], [place] [address] [phone]  [facility] [address] [place], [place] [address] [phone]  [facility] [address] [place] [address] [phone]  [facility] [address], [place] [address] [phone]  [facility] [address], [place] [address] [phone]  [facility] [address], [place] [address] [phone]  [facility] [place] [phone]  [facility] [address], [place] [address] [phone]  [facility] [address], [place] [address] [phone]  [facility] EFAX [address] [place], [place] [phone]  [facility] [address], [place] [address] [phone]  [facility] [address] [place] [address] [phone]      Situation ongoing, CSW to continue following and update chart as more  information becomes available.      [place] Sueo Cullen, MSW, LCSW  01/06/2023 12:06 PM

## 2023-01-06 NOTE — Consult Note (Addendum)
[facility] Consult Note  Patient Name: Kiara [name] MRN: [id number] DOB: [birth year] DATE OF Consult: 01/06/2023  PRIMARY PSYCHIATRIC DIAGNOSES Schizoaffective disorder bipolar type by present history Rule out delirium due to general medical condition  Based on my current evaluation and assessment of the patient, she is a 22 year old female with concerns for erratic behaviors prior to arrival to ED, and continued to demonstrate such behaviors on arrival to ED, such that patient received emergent psychotropic medication as the least restrictive means to maintain patient and staff safety. Patient was unable to engage with provider given that she was constantly falling asleep after seconds of being in a semi-wakeful state. Recommend re-evaluation tomorrow when more alert so that an appropriate psychiatric disposition may be determined.   RECOMMENDATIONS  Medication recommendations:  Risks, benefits, side effects and alternatives to treatments reviewed:   As needed medications to manage patient's acute symptoms while in hospital care: QTc is 421 ms as of 12/2022 -Maximize utilization of verbal de-escalation techniques, if attempts are unsuccessful and patient poses a threat to self and others: Consider haloperidol 2 mg to 5 mg PO/IM with diphenhydramine 25 to 50 mg PO/IM with lorazepam 1 mg to 2 mg PO/IM every 8 hours as needed for severe agitation. Would offer patient the option of taking PO medication first, but if patient refuses then may administer IM medication as a last resort. Would not exceed 20 mg of haloperidol within a 24-hour period.   Non-Medication recommendations:  -Note: Please stop all antipsychotic and QTc prolonging medications if patient's QTc is greater than 480 ms (except for aripiprazole, which demonstrates reduction of QTc per case reports and literature review). Of note, to decrease the risk of prolonged QTc, please maintain potassium and magnesium levels within normal  ranges. -Agree with work up for organic causes of altered mentation and mood dysregulation, consider the following if not already performed: CT of the head, CBC and differential, basic metabolic profile, liver function tests (if abnormal consider ammonia level), urinalysis, urine toxicology screen, vitamin B12 level, vitamin D level, TSH with reflex free T4  Observation recommendations:  per unit protocol for management of psychiatric patient that is an elopement risk  Recommendations: Is inpatient psychiatric hospitalization recommended for this patient? Recommend re-evaluation tomorrow when more alert so that an appropriate psychiatric disposition may be determined.   Follow-Up Telepsychiatry C/L services: We will continue to follow this patient with you until stabilized or discharged.  If you have any questions or concerns, please call our TeleCare Coordination service at  [phone] and ask for myself or the provider on-call.  Communication: Treatment team members (and family members if applicable) who were involved in treatment/care discussions and planning, and with whom we spoke or engaged with via secure text/chat, include the following: primary team  Thank you for involving [place] in the care of this patient. If you have any additional questions or concerns, please call [phone] and ask for me or the provider on-call.  Total time spent in this encounter was 50 minutes with greater than 50% of time spent in counseling and coordination of care.  TELEPSYCHIATRY ATTESTATION & CONSENT  As the provider for this telehealth consult, I attest that I verified the patient's identity using two separate identifiers, introduced myself to the patient, provided my credentials, disclosed my location, and performed this encounter via a HIPAA-compliant, real-time, face-to-face, two-way, interactive audio and video platform and with the full consent and agreement of the patient (or guardian as applicable.)   [place] Telehealth provider physical location: home office in state of [place].  Video start time: 0010 (Central Time) Video end time: 0020 (Central Time)  IDENTIFYING DATA  Kiara [name] is a 22 y.o. year-old female for whom a psychiatric consultation has been ordered by the primary provider. The patient was identified using two separate identifiers.  CHIEF COMPLAINT/REASON FOR CONSULT  Erratic behavior  HISTORY OF PRESENT ILLNESS (HPI)  I evaluated the patient today face-to-face via secure, HIPAA-compliant telepsychiatric connection, and at the request of the primary treatment team. The reason for the telepsychiatric consultation is that the patient is a 22 year old female with a documented history of schizoaffective disorder bipolar type who [facility] with [facility]" given that ".patient has been exhibiting erratic behavior, wandering.without appropriate clothing or shoes.". Upon arrival to the ED the apteitn was "screaming" and received IM droperidol 5 mg at about 1700. Primary team is seeking psychotropic medication recommendations, safety evaluation to determine appropriateness for more intensive psychiatric services and diagnostic clarity as to the patient's presentation.   During one-on-one evaluation with this provider, patient was in a sleep like state and multiple attempts were made by on site provider to awaken patient. She finally did engage, but did so with her eyes closed and would respond but then would quickly fall back asleep between promptings. She eventually stopped waking up to answer prompts; however, when she did respond she only provided 1 to 3 word replies. Patient was able to confirm her first and last name and date of birth; however, she was not oriented to location or situation. Patient asserted "no" when asked if she was having suicidal or homicidal ideations. She asserted "sometimes" having hallucinations, but fell asleep so promptly that  she could not provide further details about the character of these. Patient did assert "yes" when asked about having paranoia, but could not provide further details. Given the severity of patient's sedation, she was unable to meaningfully engage in treatment or safety planning.   Multiple attempts to contact patient's father using number provided in the chart were performed; however, unable to reach him.    PAST PSYCHIATRIC HISTORY  Limited given patient's poor engagement  Inpatient psychiatric treatment: per chart documentation, multiple previous Previous mental health diagnoses: per chart documentation, schizoaffective disorder bipolar type Suicide attempts: per chart documentation, denies   Otherwise as per HPI above.  PAST MEDICAL HISTORY  Past Medical History:  Diagnosis Date   Hidradenitis suppurativa of multiple sites    Schizophrenia [facility])      HOME MEDICATIONS  Facility Ordered Medications  Medication   [COMPLETED] droperidol (INAPSINE) 2.5 MG/ML injection 5 mg   potassium chloride SA (KLOR-CON M) CR tablet 40 mEq   PTA Medications  Medication Sig   ibuprofen (ADVIL) 600 MG tablet Take 1 tablet (600 mg total) by mouth every 6 (six) hours as needed for headache, mild pain, moderate pain or cramping. (Patient not taking: Reported on 01/05/2023)   oxyCODONE (OXY IR/ROXICODONE) 5 MG immediate release tablet Take 1 tablet (5 mg total) by mouth every 6 (six) hours as needed for severe pain or breakthrough pain. (Patient not taking: Reported on 01/05/2023)    ALLERGIES  No Known Allergies  SOCIAL & SUBSTANCE USE HISTORY  Social History   Socioeconomic History   Marital status: Single    Spouse name: Not on file   Number of children: Not on file   Years of education: Not on file   Highest education level: Not on file  Occupational History   Not on file  Tobacco Use   Smoking status: Every Day    Types: Cigarettes   Smokeless tobacco: Never  Vaping Use   Vaping status:  Some Days  Substance and Sexual Activity   Alcohol use: Yes   Drug use: Yes    Types: Marijuana   Sexual activity: Never    Birth control/protection: None  Other Topics Concern   Not on file  Social History Narrative   Not on file   Social Drivers of Health   Financial Resource Strain: Patient Declined (03/23/2021)   [facility]   Overall Financial Resource Strain (CARDIA)    Difficulty of Paying Living Expenses: Patient declined  Food Insecurity: Unknown (03/25/2021)   [facility]   Food Insecurity    Within the past 12 months, you worried that your food would run out before you got the money to buy more.: 98    Within the past 12 months, the food you bought just didn't last and you didn't have money to get more.: 98  Transportation Needs: Unmet Transportation Needs (12/12/2021)   PRAPARE - Transportation    Lack of Transportation (Medical): No    Lack of Transportation (Non-Medical): Yes  Physical Activity: Not on file  Stress: Stress Concern Present (03/23/2021)   [facility]   [employer] of Occupational Health - Occupational Stress Questionnaire    Feeling of Stress : To some extent  Social Connections: Not on file   Social History   Tobacco Use  Smoking Status Every Day   Types: Cigarettes  Smokeless Tobacco Never   Social History   Substance and Sexual Activity  Alcohol Use Yes   Social History   Substance and Sexual Activity  Drug Use Yes   Types: Marijuana    FAMILY HISTORY  No family history on file. Family Psychiatric History (if known):  none disclosed   MENTAL STATUS EXAM (MSE)  Mental Status Exam: General Appearance: Disheveled  Orientation:  Other:  to person only  Memory:  Immediate;   Poor Recent;   Poor Remote;   Poor  Concentration:  Concentration: Poor and Attention Span: Poor  Recall:  Poor  Attention  Poor  Eye Contact:  Minimal  Speech:   Garbled and Slow  Language:  Poor  Volume:  Decreased  Mood: unable to determine given limited engagement in assessment  Affect:  Constricted  Thought Process:  unable to determine given limited engagement in assessment  Thought Content:  unable to determine given limited engagement in assessment  Suicidal Thoughts:  No  Homicidal Thoughts:  No  Judgement:  Impaired  Insight:  Lacking  Psychomotor Activity:  Psychomotor Retardation  Akathisia:  No  Fund of Knowledge:  Poor    Assets:  Others:  current management in hospital  Cognition:  Impaired,  Severe  ADL's:  Impaired  AIMS (if indicated):       VITALS  Blood pressure (!) 127/96, pulse 87, temperature 98.5 F (36.9 C), temperature source Oral, resp. rate 16, height 5\' 3"  (1.6 m), weight 54.4 kg, last menstrual period 12/15/2022, SpO2 100%.  LABS  Admission on 01/05/2023  Component Date Value Ref Range Status   Alcohol, Ethyl (B) 01/05/2023 <10  <10 mg/dL Final   Comment: (NOTE) Lowest detectable limit for serum alcohol is 10 mg/dL.  For medical purposes only. Performed at [facility], [address] [place], [place] [address]    Opiates 01/05/2023  NONE DETECTED  NONE DETECTED Final   Cocaine 01/05/2023 NONE DETECTED  NONE DETECTED Final   Benzodiazepines 01/05/2023 NONE DETECTED  NONE DETECTED Final   Amphetamines 01/05/2023 NONE DETECTED  NONE DETECTED Final   Tetrahydrocannabinol 01/05/2023 POSITIVE (A)  NONE DETECTED Final   Barbiturates 01/05/2023 NONE DETECTED  NONE DETECTED Final   Comment: (NOTE) DRUG SCREEN FOR MEDICAL PURPOSES ONLY.  IF CONFIRMATION IS NEEDED FOR ANY PURPOSE, NOTIFY LAB WITHIN 5 DAYS.  LOWEST DETECTABLE LIMITS FOR URINE DRUG SCREEN Drug Class                     Cutoff (ng/mL) Amphetamine and metabolites    1000 Barbiturate and metabolites    200 Benzodiazepine                 200 Opiates and metabolites        300 Cocaine and metabolites        300 THC                             50 Performed at [facility], [address] [place], [place] [address]    WBC 01/05/2023 7.1  4.0 - 10.5 K/uL Final   RBC 01/05/2023 4.33  3.87 - 5.11 MIL/uL Final   Hemoglobin 01/05/2023 13.7  12.0 - 15.0 g/dL Final   HCT 96/29/5284 41.7  36.0 - 46.0 % Final   MCV 01/05/2023 96.3  80.0 - 100.0 fL Final   MCH 01/05/2023 31.6  26.0 - 34.0 pg Final   MCHC 01/05/2023 32.9  30.0 - 36.0 g/dL Final   RDW 13/24/4010 14.0  11.5 - 15.5 % Final   Platelets 01/05/2023 224  150 - 400 K/uL Final   nRBC 01/05/2023 0.0  0.0 - 0.2 % Final   Neutrophils Relative % 01/05/2023 37  % Final   Neutro Abs 01/05/2023 2.6  1.7 - 7.7 K/uL Final   Lymphocytes Relative 01/05/2023 45  % Final   Lymphs Abs 01/05/2023 3.2  0.7 - 4.0 K/uL Final   Monocytes Relative 01/05/2023 16  % Final   Monocytes Absolute 01/05/2023 1.1 (H)  0.1 - 1.0 K/uL Final   Eosinophils Relative 01/05/2023 2  % Final   Eosinophils Absolute 01/05/2023 0.1  0.0 - 0.5 K/uL Final   Basophils Relative 01/05/2023 0  % Final   Basophils Absolute 01/05/2023 0.0  0.0 - 0.1 K/uL Final   Immature Granulocytes 01/05/2023 0  % Final   Abs Immature Granulocytes 01/05/2023 0.02  0.00 - 0.07 K/uL Final   Performed at [facility], [address] [place], [place] [address]   Preg, Serum 01/05/2023 NEGATIVE  NEGATIVE Final   Comment:        THE SENSITIVITY OF THIS METHODOLOGY IS >10 mIU/mL. Performed at [facility], [address] [place], [place] [address]    Acetaminophen (Tylenol), Serum 01/05/2023 <10 (L)  10 - 30 ug/mL Final   Comment: (NOTE) Therapeutic concentrations vary significantly. A range of 10-30 ug/mL  may be an effective concentration for many patients. However, some  are best treated at concentrations outside of this range. Acetaminophen concentrations >150 ug/mL at 4 hours after ingestion  and >50 ug/mL at 12 hours after ingestion are often associated with   toxic reactions.  Performed at [facility], [address], [place], [place] [address]    Salicylate Lvl 01/05/2023 <7.0 (L)  7.0 - 30.0 mg/dL Final   Performed at [facility] [address], [place], [place] [address]   SARS Coronavirus 2 by RT PCR 01/05/2023 NEGATIVE  NEGATIVE Final   Comment: (NOTE) SARS-CoV-2 target nucleic acids are NOT DETECTED.  The SARS-CoV-2 RNA is generally detectable in upper respiratory specimens during the acute phase of infection. The lowest concentration of SARS-CoV-2 viral copies this assay can detect is 138 copies/mL. A negative result does not preclude SARS-Cov-2 infection and should not be used as the sole basis for treatment or other patient management decisions. A negative result may occur with  improper specimen collection/handling, submission of specimen other than nasopharyngeal swab, presence of viral mutation(s) within the areas targeted by this assay, and inadequate number of viral copies(<138 copies/mL). A negative result must be combined with clinical observations, patient history, and epidemiological information. The expected result is Negative.  Fact Sheet for Patients:  BloggerCourse.com  Fact Sheet for Healthcare Providers:  SeriousBroker.it  This test is no                          t yet approved or cleared by the [place] FDA and  has been authorized for detection and/or diagnosis of SARS-CoV-2 by FDA under an Emergency Use Authorization (EUA). This EUA will remain  in effect (meaning this test can be used) for the duration of the COVID-19 declaration under Section 564(b)(1) of the Act, 21 U.S.C.section 360bbb-3(b)(1), unless the authorization is terminated  or revoked sooner.       Influenza A by PCR 01/05/2023 NEGATIVE  NEGATIVE Final   Influenza B by PCR 01/05/2023 NEGATIVE  NEGATIVE Final   Comment: (NOTE) The Xpert Xpress  SARS-CoV-2/FLU/RSV plus assay is intended as an aid in the diagnosis of influenza from Nasopharyngeal swab specimens and should not be used as a sole basis for treatment. Nasal washings and aspirates are unacceptable for Xpert Xpress SARS-CoV-2/FLU/RSV testing.  Fact Sheet for Patients: BloggerCourse.com  Fact Sheet for Healthcare Providers: SeriousBroker.it  This test is not yet approved or cleared by the [place] FDA and has been authorized for detection and/or diagnosis of SARS-CoV-2 by FDA under an Emergency Use Authorization (EUA). This EUA will remain in effect (meaning this test can be used) for the duration of the COVID-19 declaration under Section 564(b)(1) of the Act, 21 U.S.C. section 360bbb-3(b)(1), unless the authorization is terminated or revoked.     Resp Syncytial Virus by PCR 01/05/2023 NEGATIVE  NEGATIVE Final   Comment: (NOTE) Fact Sheet for Patients: BloggerCourse.com  Fact Sheet for Healthcare Providers: SeriousBroker.it  This test is not yet approved or cleared by the [place] FDA and has been authorized for detection and/or diagnosis of SARS-CoV-2 by FDA under an Emergency Use Authorization (EUA). This EUA will remain in effect (meaning this test can be used) for the duration of the COVID-19 declaration under Section 564(b)(1) of the Act, 21 U.S.C. section 360bbb-3(b)(1), unless the authorization is terminated or revoked.  Performed at [facility], [address], [place], [place] [address]    Sodium 01/05/2023 139  135 - 145 mmol/L Final   Potassium 01/05/2023 3.3 (L)  3.5 - 5.1 mmol/L Final   Chloride 01/05/2023 109  98 - 111 mmol/L Final   CO2 01/05/2023 22  22 - 32 mmol/L Final   Glucose, Bld 01/05/2023 90  70 - 99 mg/dL Final   Glucose reference range applies only to samples taken after  fasting for at least 8 hours.    BUN 01/05/2023 8  6 - 20 mg/dL Final   Creatinine, Ser 01/05/2023 0.50  0.44 - 1.00 mg/dL Final   Calcium 29/56/2130 9.0  8.9 - 10.3 mg/dL Final   Total Protein 86/57/8469 6.9  6.5 - 8.1 g/dL Final   Albumin 62/95/2841 4.0  3.5 - 5.0 g/dL Final   AST 32/44/0102 26  15 - 41 U/L Final   ALT 01/05/2023 17  0 - 44 U/L Final   Alkaline Phosphatase 01/05/2023 48  38 - 126 U/L Final   Total Bilirubin 01/05/2023 0.9  <1.2 mg/dL Final   GFR, Estimated 01/05/2023 >60  >60 mL/min Final   Comment: (NOTE) Calculated using the CKD-EPI Creatinine Equation (2021)    Anion gap 01/05/2023 8  5 - 15 Final   Performed at [facility], [address] [place], [place] [address]    PSYCHIATRIC REVIEW OF SYSTEMS (ROS)  ROS: Notable for the following relevant positive findings: Review of Systems  Psychiatric/Behavioral:  Positive for hallucinations, memory loss and substance abuse. Negative for depression and suicidal ideas. The patient is nervous/anxious and has insomnia.     Additional findings:      Musculoskeletal: No abnormal movements observed      Gait & Station: Laying/Sitting      Pain Screening: unable to determine given limited engagement in assessment      Nutrition & Dental Concerns: unable to determine given limited engagement in assessment  RISK FORMULATION/ASSESSMENT  Is the patient experiencing any suicidal or homicidal ideations: No       Explain: Patient asserts no, however given her limited engagement would recommend re-visiting safety evaluation when more appropriate and alert Protective factors considered for safety management: current care in a highly monitored health care setting  Risk factors/concerns considered for safety management:  Substance abuse/dependence Impulsivity Aggression Barriers to accessing treatment  Is there a safety management plan with the patient and treatment team to minimize risk factors and promote protective factors: Yes            Explain: continued multidisciplinary management of patient's psychosocial, medical and mental health needs Is crisis care placement or psychiatric hospitalization recommended: Recommend re-evaluation tomorrow when more alert so that an appropriate psychiatric disposition may be determined.      Based on my current evaluation and risk assessment, patient is determined at this time to be at:  High risk  *RISK ASSESSMENT Risk assessment is a dynamic process; it is possible that this patient's condition, and risk level, may change. This should be re-evaluated and managed over time as appropriate. Please re-consult psychiatric consult services if additional assistance is needed in terms of risk assessment and management. If your team decides to discharge this patient, please advise the patient how to best access emergency psychiatric services, or to call 911, if their condition worsens or they feel unsafe in any way.   Rodena Medin, MD Telepsychiatry Consult Services

## 2023-01-06 NOTE — ED Notes (Signed)
Sheriff called and advised they would arrive in approximately 1.5hrs

## 2023-01-06 NOTE — ED Notes (Signed)
Writer contacted [facility] and left callback number via pager

## 2023-01-06 NOTE — ED Notes (Signed)
[facility] called pts father Deshara Craigie) for collateral. [facility] left a HIPAA compliant message to return the call.  Jacquelynn Cree, [facility]   01/06/23

## 2023-01-19 ENCOUNTER — Other Ambulatory Visit: Payer: Self-pay

## 2023-01-19 ENCOUNTER — Emergency Department (HOSPITAL_COMMUNITY)
Admission: EM | Admit: 2023-01-19 | Discharge: 2023-01-22 | Disposition: A | Discharge status: Home / Self Care | Payer: MEDICAID | Attending: Emergency Medicine | Admitting: Emergency Medicine

## 2023-01-19 DIAGNOSIS — F250 Schizoaffective disorder, bipolar type: Secondary | ICD-10-CM | POA: Diagnosis present

## 2023-01-19 DIAGNOSIS — Z79899 Other long term (current) drug therapy: Secondary | ICD-10-CM | POA: Diagnosis not present

## 2023-01-19 DIAGNOSIS — F3010 Manic episode without psychotic symptoms, unspecified: Secondary | ICD-10-CM

## 2023-01-19 DIAGNOSIS — R456 Violent behavior: Secondary | ICD-10-CM | POA: Insufficient documentation

## 2023-01-19 MED ORDER — STERILE WATER FOR INJECTION IJ SOLN
INTRAMUSCULAR | Status: AC
  Filled 2023-01-19: qty 10

## 2023-01-19 MED ORDER — ZIPRASIDONE MESYLATE 20 MG IM SOLR
10.0000 mg | Freq: Once | INTRAMUSCULAR | Status: AC
  Administered 2023-01-19: 10 mg via INTRAMUSCULAR
  Filled 2023-01-19: qty 20

## 2023-01-19 NOTE — ED Notes (Signed)
 Patient in room screaming at provider. Patient spitting at staff in room. Patient attempting to get out of restraints. Patient redirected but unsuccessful.
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
## 2023-01-20 ENCOUNTER — Encounter (HOSPITAL_COMMUNITY): Payer: Self-pay | Admitting: Psychiatry

## 2023-01-20 DIAGNOSIS — F250 Schizoaffective disorder, bipolar type: Secondary | ICD-10-CM

## 2023-01-20 LAB — COMPREHENSIVE METABOLIC PANEL
ALT: 36 U/L (ref 0–44)
AST: 35 U/L (ref 15–41)
Albumin: 3.9 g/dL (ref 3.5–5.0)
Alkaline Phosphatase: 68 U/L (ref 38–126)
Anion gap: 9 (ref 5–15)
BUN: 12 mg/dL (ref 6–20)
CO2: 23 mmol/L (ref 22–32)
Calcium: 9.4 mg/dL (ref 8.9–10.3)
Chloride: 103 mmol/L (ref 98–111)
Creatinine, Ser: 0.45 mg/dL (ref 0.44–1.00)
GFR, Estimated: >60 mL/min (ref >60)
Glucose, Bld: 99 mg/dL (ref 70–99)
Potassium: 3.7 mmol/L (ref 3.5–5.1)
Sodium: 135 mmol/L (ref 135–145)
Total Bilirubin: 0.5 mg/dL (ref 0.0–1.2)
Total Protein: 7.6 g/dL (ref 6.5–8.1)

## 2023-01-20 LAB — RAPID URINE DRUG SCREEN, HOSP PERFORMED
Amphetamines: NOT DETECTED
Barbiturates: NOT DETECTED
Benzodiazepines: POSITIVE — AB
Cocaine: NOT DETECTED
Opiates: NOT DETECTED
Tetrahydrocannabinol: POSITIVE — AB

## 2023-01-20 LAB — CBC WITH DIFFERENTIAL/PLATELET
Abs Immature Granulocytes: 0.03 10*3/uL (ref 0.00–0.07)
Basophils Absolute: 0 10*3/uL (ref 0.0–0.1)
Basophils Relative: 0 %
Eosinophils Absolute: 0.1 10*3/uL (ref 0.0–0.5)
Eosinophils Relative: 1 %
HCT: 37.2 % (ref 36.0–46.0)
Hemoglobin: 12.3 g/dL (ref 12.0–15.0)
Immature Granulocytes: 0 %
Lymphocytes Relative: 21 %
Lymphs Abs: 1.8 10*3/uL (ref 0.7–4.0)
MCH: 31.1 pg (ref 26.0–34.0)
MCHC: 33.1 g/dL (ref 30.0–36.0)
MCV: 93.9 fL (ref 80.0–100.0)
Monocytes Absolute: 1 10*3/uL (ref 0.1–1.0)
Monocytes Relative: 11 %
Neutro Abs: 5.6 10*3/uL (ref 1.7–7.7)
Neutrophils Relative %: 67 %
Platelets: 200 10*3/uL (ref 150–400)
RBC: 3.96 MIL/uL (ref 3.87–5.11)
RDW: 13.9 % (ref 11.5–15.5)
WBC: 8.4 10*3/uL (ref 4.0–10.5)
nRBC: 0 % (ref 0.0–0.2)

## 2023-01-20 LAB — ETHANOL: Alcohol, Ethyl (B): <10 mg/dL (ref <10)

## 2023-01-20 LAB — PREGNANCY, URINE: Preg Test, Ur: NEGATIVE

## 2023-01-20 LAB — SALICYLATE LEVEL: Salicylate Lvl: <7 mg/dL — ABNORMAL LOW (ref 7.0–30.0)

## 2023-01-20 LAB — ACETAMINOPHEN LEVEL: Acetaminophen (Tylenol), Serum: <10 ug/mL — ABNORMAL LOW (ref 10–30)

## 2023-01-20 MED ORDER — ZIPRASIDONE MESYLATE 20 MG IM SOLR
20.0000 mg | Freq: Once | INTRAMUSCULAR | Status: AC
  Administered 2023-01-20: 20 mg via INTRAMUSCULAR
  Filled 2023-01-20: qty 20

## 2023-01-20 MED ORDER — DIPHENHYDRAMINE HCL 50 MG/ML IJ SOLN
50.0000 mg | Freq: Once | INTRAMUSCULAR | Status: AC
  Administered 2023-01-20: 50 mg via INTRAMUSCULAR
  Filled 2023-01-20: qty 1

## 2023-01-20 MED ORDER — LORAZEPAM 2 MG/ML IJ SOLN
2.0000 mg | Freq: Once | INTRAMUSCULAR | Status: AC
  Administered 2023-01-20: 2 mg via INTRAMUSCULAR
  Filled 2023-01-20: qty 1

## 2023-01-20 MED ORDER — RISPERIDONE 0.5 MG PO TBDP
1.0000 mg | ORAL_TABLET | Freq: Two times a day (BID) | ORAL | Status: DC
  Administered 2023-01-20 – 2023-01-22 (×4): 1 mg via ORAL
  Filled 2023-01-20 (×4): qty 2

## 2023-01-20 NOTE — ED Notes (Signed)
 Bilateral ankle and wrist restraints removed at this time patient is asleep and nonviolent or a threat to self or others

## 2023-01-20 NOTE — BH Assessment (Addendum)
 TTS Consult will be completed by IRIS. IRIS Coordinator will communicate in established secure chat assessment time and provider name. Thanks

## 2023-01-20 NOTE — ED Notes (Signed)
 Patient calm breathing eyes closed

## 2023-01-20 NOTE — ED Notes (Signed)
 Pt has been calm and cooperative with staff, medication compliant, denies any needs at this time.

## 2023-01-20 NOTE — ED Notes (Addendum)
 Patient removed vital cords, up in hallway yelling and screaming and begin to take off her clothes. Nurse escorted patient back to room patient became violent begin to bite, scream and attack staff. MD notified, restraints restarted and PRN medicine ordered.

## 2023-01-20 NOTE — ED Notes (Signed)
 Sent reminder to horton to put in order for restraints

## 2023-01-20 NOTE — ED Notes (Signed)
 Provider Horton reminded to put in order for restraints. No new order at this time

## 2023-01-20 NOTE — ED Notes (Signed)
 [facility] called pts father Mikaia Janvier) for collateral. [facility] left a HIPAA compliant message to return the call.   Jacquelynn Cree, [facility]  01/20/23

## 2023-01-20 NOTE — ED Notes (Signed)
 Patient calm resting in bed breathing eyes closed

## 2023-01-20 NOTE — ED Notes (Signed)
 TTS called to speak with patient and she refused to speak with them stating she just wants to sleep

## 2023-01-20 NOTE — ED Notes (Signed)
 Patient removed wrist restraints, yelling screaming and extremely agitated. Restraints reapplied

## 2023-01-20 NOTE — ED Notes (Signed)
 RN gave patient lunch tray patient threw tray and urinated all over her self she is in bathroom changing now.

## 2023-01-20 NOTE — Consult Note (Signed)
Attempted to see patient for consult, she was laying with covers over her head.  She declined speaking with provider stating she wanted to sleep.

## 2023-01-20 NOTE — ED Notes (Signed)
 Patient screaming and spitting at staff. Patient attempting to grab at staff wrist. Patient kicking and screaming in the bed using foul language such as "fuck the police Fuck god seizures, bitch" patient redirected but uncooperative
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
## 2023-01-20 NOTE — ED Provider Notes (Addendum)
  Physical Exam  BP 114/75 (BP Location: Right Arm)   Pulse 88   Temp 98.1 F (36.7 C) (Oral)   Resp 18   LMP 12/15/2022   SpO2 100%   Physical Exam  Procedures  .Critical Care  Performed by: Charlyn Sora, MD Authorized by: Charlyn Sora, MD   Critical care provider statement:    Critical care time (minutes):  30   Critical care was necessary to treat or prevent imminent or life-threatening deterioration of the following conditions: Psychiatry crisis, aggressive behavior.   Critical care was time spent personally by me on the following activities:  Development of treatment plan with patient or surrogate, discussions with consultants, evaluation of patient's response to treatment, examination of patient, ordering and review of laboratory studies, ordering and review of radiographic studies, ordering and performing treatments and interventions, pulse oximetry, re-evaluation of patient's condition and review of old charts   ED Course / MDM    Medical Decision Making Amount and/or Complexity of Data Reviewed Labs: ordered.  Risk Prescription drug management.   Had assumed care of this patient from Dr. Bari at 7:10 AM. Patient brought by the [facility] department.  Patient was found knocking on people's door, looking for weed.  She was extremely agitated, and pointing her finger at the police as if she was holding a gun.  She received 10 mg of Geodon  followed by Ativan  and Benadryl  because of her extreme agitation.  At 7:45 AM, patient woke up and became hostile again.  She is spitting at our staff, swinging at them.  She is not easy to direct.  We will have to put restraints order for the safety of our staff.  I will order 20 IM Geodon .     Charlyn Sora, MD 01/20/23 [phone]  Reassessment at 9 AM: Patient has calm down, but still easily gets excited.  I have requested that the nursing staff remove the restraints once patient calms down further.     Reassessment at 3:30 PM: Patient now calm.  I called family and spoke with her father.  He states that patient has not been taking her medication and slowly getting worse.  Yesterday she was acting as if she was psychotic.  He is not comfortable with her coming back until psychiatry clears her.  He does not think that the weed caused her symptoms.   Charlyn Sora, MD 01/20/23 [phone]
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
## 2023-01-21 ENCOUNTER — Encounter (HOSPITAL_COMMUNITY): Payer: Self-pay | Admitting: Emergency Medicine

## 2023-01-21 MED ORDER — STERILE WATER FOR INJECTION IJ SOLN: 1.2000 mL | Freq: Once | INTRAMUSCULAR | Status: AC

## 2023-01-21 MED ORDER — ZIPRASIDONE MESYLATE 20 MG IM SOLR
INTRAMUSCULAR | Status: AC
  Administered 2023-01-21: 20 mg via INTRAMUSCULAR
  Filled 2023-01-21: qty 20

## 2023-01-21 MED ORDER — OLANZAPINE 10 MG IM SOLR
5.0000 mg | Freq: Once | INTRAMUSCULAR | Status: AC | PRN
  Administered 2023-01-21: 5 mg via INTRAMUSCULAR
  Filled 2023-01-21: qty 10

## 2023-01-21 MED ORDER — ZIPRASIDONE MESYLATE 20 MG IM SOLR: 20.0000 mg | Freq: Once | INTRAMUSCULAR | Status: AC

## 2023-01-21 MED ORDER — LORAZEPAM 2 MG/ML IJ SOLN
INTRAMUSCULAR | Status: AC
  Administered 2023-01-21: 2 mg via INTRAMUSCULAR
  Filled 2023-01-21: qty 1

## 2023-01-21 MED ORDER — MELATONIN 3 MG PO TABS
3.0000 mg | ORAL_TABLET | Freq: Every day | ORAL | Status: DC
  Administered 2023-01-21: 3 mg via ORAL
  Filled 2023-01-21: qty 1

## 2023-01-21 MED ORDER — STERILE WATER FOR INJECTION IJ SOLN
INTRAMUSCULAR | Status: AC
  Administered 2023-01-21: 1.2 mL via INTRAMUSCULAR
  Filled 2023-01-21: qty 10

## 2023-01-21 MED ORDER — LORAZEPAM 2 MG/ML IJ SOLN: 2.0000 mg | Freq: Once | INTRAMUSCULAR | Status: AC

## 2023-01-21 NOTE — ED Notes (Signed)
 Pt got out of stretcher and is standing in the hallway near the secretary desk blowing her nose, sitter is standing behind patient

## 2023-01-21 NOTE — ED Provider Notes (Signed)
 Patient briefly escaped the emergency department despite 1:1 sitter.  Was found by [facility] PD and brought back to ED.    Coral Spikes, DO 01/21/23 1452

## 2023-01-21 NOTE — ED Notes (Signed)
 Writer notified pt has been found, [employer] is bringing pt back to hospital.

## 2023-01-21 NOTE — Progress Notes (Signed)
 Pt was accepted to [facility] TODAY 01/21/2023; Bed Assignment [address]  Pt meets inpatient criteria per Efrain Allen PIETY  Attending Physician will be Dr. Oneil SQUIBB. [place], MD  Report can be called to: - [phone]  Pt can arrive after: 1:00pm  Care Team notified: Ester Orvan Fret [address] LCSWA 01/21/2023 @ 2:54 AM

## 2023-01-21 NOTE — ED Notes (Signed)
 Pt has been brought back to the ED via [employer], pt is in handcuffs and ambulatory. Pt taken back to [facility], medication has been order to be given.

## 2023-01-21 NOTE — ED Notes (Addendum)
 RN Butler updated pt on plan of care, pt was upset and became boisterous and verbally aggressive. Pt not happy about plans to leave. Unable to deescalate pt at this time security and officers called to bedside. Provider made aware pt needing medication. Verbal orders given-see MAR.

## 2023-01-21 NOTE — ED Notes (Signed)
 Writer made aware pt has left the building. She is IVC'd, staff and security trying to obtain pt. They were unable to locate pt and [employer]  was notified by off duty and our security.

## 2023-01-21 NOTE — ED Notes (Signed)
 This RN walked out of another patients room and was advised by sitter and other staff in the area that patient Eloped. They state they saw patient running out of the ED

## 2023-01-21 NOTE — ED Provider Notes (Signed)
 Emergency Medicine Observation Re-evaluation Note  Kiara [name] is a 23 y.o. female, seen on rounds today.  Pt initially presented to the ED for complaints of Manic Behavior (Patient brought in by [facility] department. Per Sheriff patient was knocking on people doors looking for her weed that she claims someone stole from her. Someone called the police and patient was pointing her fingers in police face in shape of a gun and then patient was arrested. ) Currently, the patient is resting   Physical Exam  BP 114/81 (BP Location: Right Arm)   Pulse 91   Temp 97.9 F (36.6 C) (Oral)   Resp 20   Ht 5' 3 (1.6 m)   Wt 55 kg   LMP 12/15/2022   SpO2 100%   BMI 21.48 kg/m  Physical Exam General: NAD Cardiac: rr Lungs: normal WOB  Psych: Calm   ED Course / MDM  EKG:   I have reviewed the labs performed to date as well as medications administered while in observation.  Recent changes in the last 24 hours include None.  Plan  Current plan is for inpatient care; accepted to [facility].    Neysa Caron PARAS, DO 01/21/23 1049

## 2023-01-21 NOTE — ED Notes (Signed)
 Sheriff called for patient transport. States will call back with ETA.

## 2023-01-21 NOTE — Progress Notes (Signed)
 LCSW Progress Note  [id number]   Sydell Prowell  01/21/2023  2:00 AM    Inpatient Behavioral Health Placement  Pt meets inpatient criteria per Efrain Patient, NP. There are no available beds within [facility] per [facility] Kim Brooks,RN. Referral was sent to the following facilities;  [facility] [address] [place], [place] [address] [phone]  [facility], [place] [phone]  [facility] [address] [place] [address] [phone]  [facility] [address] [place] [address] [phone]  [facility] [address] [place], [place] [address] [phone]  [facility] [address] [place], [place] [address] [phone]  [facility] [address] [place] [address] [phone]  [facility] [address] [place] [address] [phone]  [facility] [address] [place] [address] [phone]  [facility] [address] [place] [address] [phone]  [facility] [address] [place] [address] [phone]  [facility] [address], [place] [address] [phone]  [facility] [place] [phone]  [facility] [address], [place] [address] [phone]  [facility] EFA[facility] [address] [place], [place] [phone]  [facility] [address], [place] [address] [phone]  [facility] [address], [place] [address] [phone]  [facility] [address], [place] [address] [phone]  [facility] [address], [place] [address] [phone]  [facility] [address] [place] [address] [phone]  [facility] [address] [place] [address] [phone]  [facility] [address], [place] [address] [phone]  [facility] [address] [place] [address] [phone]  [facility] [address] [place] [address] [phone]  [facility] [address] [place] [address] [phone]     Situation ongoing,  CSW will follow up.    Mitzie GEANNIE Pinal, MSW, LCSWA 01/21/2023 2:00 AM

## 2023-01-22 MED ORDER — LORAZEPAM 1 MG PO TABS
1.0000 mg | ORAL_TABLET | Freq: Once | ORAL | Status: AC
  Administered 2023-01-22: 1 mg via ORAL
  Filled 2023-01-22: qty 1

## 2023-01-22 NOTE — ED Notes (Signed)
 Nurse reached out to provider regarding pt aggressive behavior. she slowly became agitated and aggressive throwing things in her room and tearing door plastic doors due to not being provided snacks after restricted time frame.

## 2023-01-22 NOTE — ED Notes (Signed)
 Contacted [facility] due to there being no notes that they were contacted, and considering pt did not make it there yesterday because of eloping. Spoke to [place] from [facility] and she advised for report to be called again in the morning after pt in picked up by [facility] department.  [address] [phone] Receiving Doctor: Dr.Mark Crispin

## 2023-01-22 NOTE — ED Notes (Signed)
Ativan given

## 2023-01-22 NOTE — ED Notes (Signed)
 Patient off unit to facility per report.   Patient alert, cooperative, no s/s of distress at this time. Discharge information and belongings given to sheriff for transport. Patient ambulatory off unit, escorted and transported by sheriff.

## 2023-01-22 NOTE — ED Notes (Signed)
 Called Kiara [name].   Did not take report .   Gave call back information.

## 2023-02-18 ENCOUNTER — Emergency Department (HOSPITAL_COMMUNITY)
Admission: EM | Admit: 2023-02-18 | Discharge: 2023-02-18 | Disposition: A | Discharge status: Home / Self Care | Payer: MEDICAID | Attending: Emergency Medicine | Admitting: Emergency Medicine

## 2023-02-18 DIAGNOSIS — R059 Cough, unspecified: Secondary | ICD-10-CM | POA: Diagnosis present

## 2023-02-18 DIAGNOSIS — Z1152 Encounter for screening for COVID-19: Secondary | ICD-10-CM | POA: Diagnosis not present

## 2023-02-18 DIAGNOSIS — J069 Acute upper respiratory infection, unspecified: Secondary | ICD-10-CM | POA: Diagnosis not present

## 2023-02-18 LAB — RESP PANEL BY RT-PCR (RSV, FLU A&B, COVID)  RVPGX2
Influenza A by PCR: NEGATIVE
Influenza B by PCR: NEGATIVE
Resp Syncytial Virus by PCR: NEGATIVE
SARS Coronavirus 2 by RT PCR: NEGATIVE

## 2023-02-18 MED ORDER — BENZONATATE 100 MG PO CAPS: 100.0000 mg | ORAL_CAPSULE | Freq: Once | ORAL | Status: DC

## 2023-02-18 MED ORDER — BENZONATATE 100 MG PO CAPS: 100.0000 mg | ORAL_CAPSULE | Freq: Three times a day (TID) | ORAL | 0 refills | Status: AC

## 2023-02-18 NOTE — ED Triage Notes (Signed)
 Pt. Will NOT speak wants to write but makes no sense.

## 2023-02-18 NOTE — ED Provider Notes (Signed)
 [place] EMERGENCY DEPARTMENT AT [facility] Provider Note   CSN: [id number] Arrival date & time: 02/18/23  9183     History  Chief Complaint  Patient presents with   Cough    Kiara [name] is a 23 y.o. female.  23 year old female is here today for cough.  Symptoms began a couple of days ago.  Patient with a history of schizophrenia, today is using only the right rather than speak, however she was able to clearly communicate using a pen and paper.   Cough      Home Medications Prior to Admission medications   Medication Sig Start Date End Date Taking? Authorizing Provider  benzonatate  (TESSALON ) 100 MG capsule Take 1 capsule (100 mg total) by mouth every 8 (eight) hours. 02/18/23  Yes Mannie Pac T, DO  ibuprofen  (ADVIL ) 600 MG tablet Take 1 tablet (600 mg total) by mouth every 6 (six) hours as needed for headache, mild pain, moderate pain or cramping. Patient not taking: Reported on 01/21/2023 12/12/21   Zina Jerilynn LABOR, MD  oxyCODONE  (OXY IR/ROXICODONE ) 5 MG immediate release tablet Take 1 tablet (5 mg total) by mouth every 6 (six) hours as needed for severe pain or breakthrough pain. Patient not taking: Reported on 01/21/2023 12/12/21   Zina Jerilynn LABOR, MD      Allergies    Patient has no known allergies.    Review of Systems   Review of Systems  Respiratory:  Positive for cough.     Physical Exam Updated Vital Signs BP (!) 114/97 (BP Location: Right Arm)   Pulse 84   Temp (!) 97.4 F (36.3 C)   Resp 16   SpO2 98%  Physical Exam Vitals reviewed.  Constitutional:      Appearance: Normal appearance.  Cardiovascular:     Rate and Rhythm: Normal rate.  Pulmonary:     Effort: Pulmonary effort is normal. No respiratory distress.  Skin:    General: Skin is warm and dry.  Neurological:     General: No focal deficit present.     Mental Status: She is alert.  Psychiatric:        Mood and Affect: Mood normal.     ED Results / Procedures /  Treatments   Labs (all labs ordered are listed, but only abnormal results are displayed) Labs Reviewed  RESP PANEL BY RT-PCR (RSV, FLU A&B, COVID)  [id number]    EKG None  Radiology No results found.  Procedures Procedures    Medications Ordered in ED Medications  benzonatate  (TESSALON ) capsule 100 mg (has no administration in time range)    ED Course/ Medical Decision Making/ A&P                                 Medical Decision Making 23 year old female here today for cough and congestion.  Plan-likely viral syndrome.  Patient with normal vital signs.  Clear lungs.  Swabbed.  Discharged with instructions, Tessalon .  Regarding the writing, this is unusual behavior however does not rise to level of concerning to my medical opinion.  Will discharge.  Risk Prescription drug management.           Final Clinical Impression(s) / ED Diagnoses Final diagnoses:  Viral URI with cough    Rx / DC Orders ED Discharge Orders          Ordered    benzonatate  (TESSALON ) 100 MG capsule  Every 8  hours        02/18/23 0833              Mannie Pac T, DO 02/18/23 [phone]

## 2023-02-19 IMAGING — CT CT HEAD W/O CM
3 series · 14 of 47 positions shown, 16 images · non-contrast
Comparison: None.

CLINICAL DATA: Delirium new onset psychosis

EXAM:
CT HEAD WITHOUT CONTRAST
TECHNIQUE: Contiguous axial images were obtained from the base of the skull
through the vertex without intravenous contrast.

[Series 2: head wo · axial · 0.47mm/px · z∈[+1395,+1530]mm · 8 of 33 slices shown, 10 images]
[im 3/33  brain]
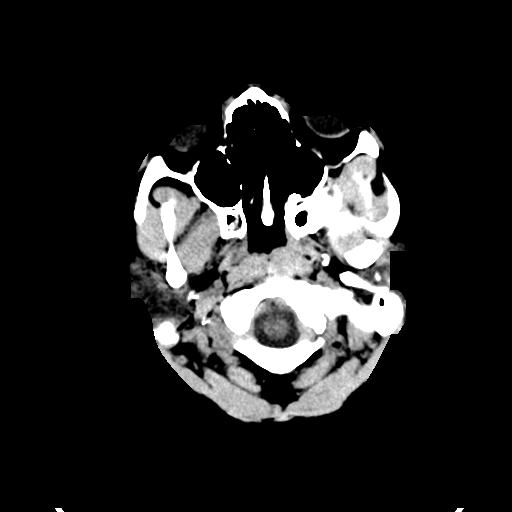
[im 3/33  bone]
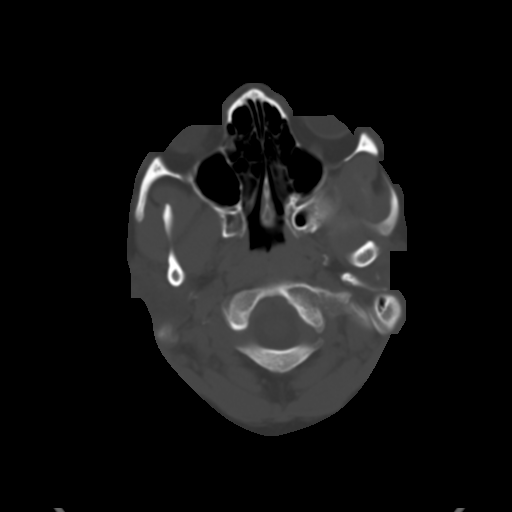
[im 7/33  brain]
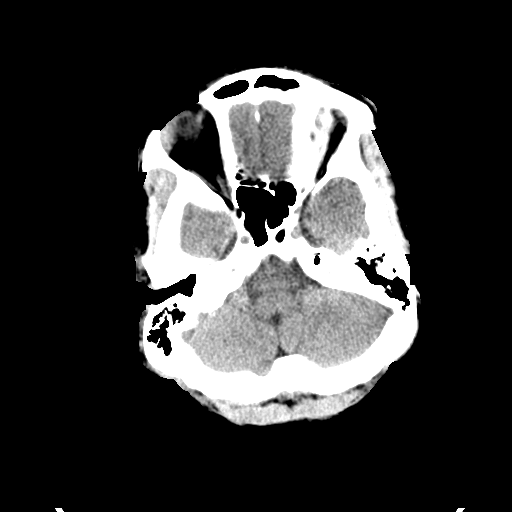
[im 10/33  brain]
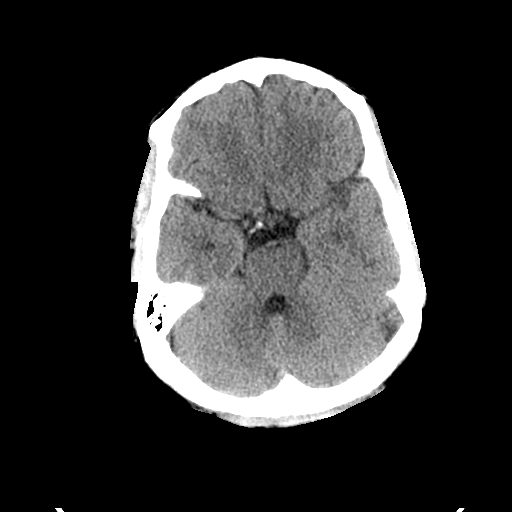
[im 15/33  brain]
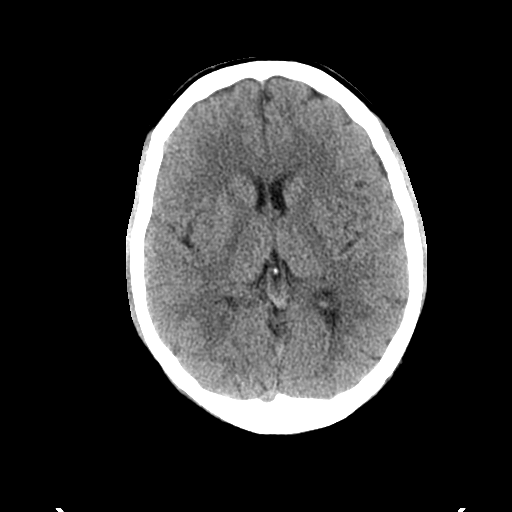
[im 18/33  brain]
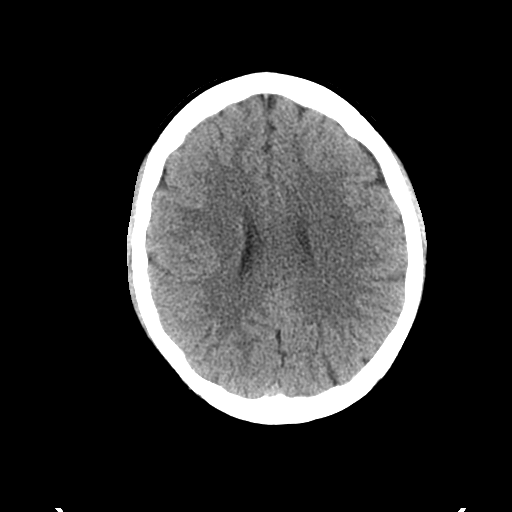
[im 18/33  bone]
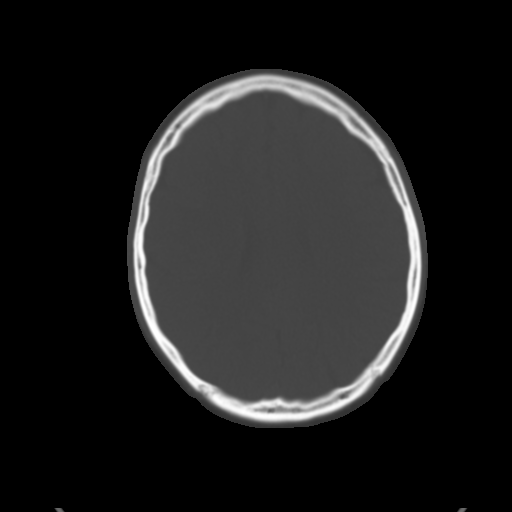
[im 23/33  brain]
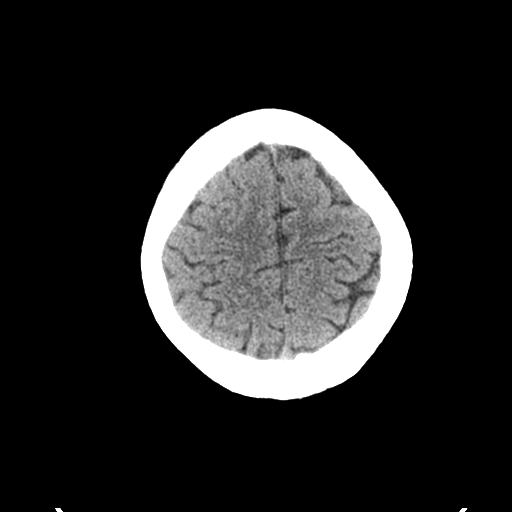
[im 26/33  brain]
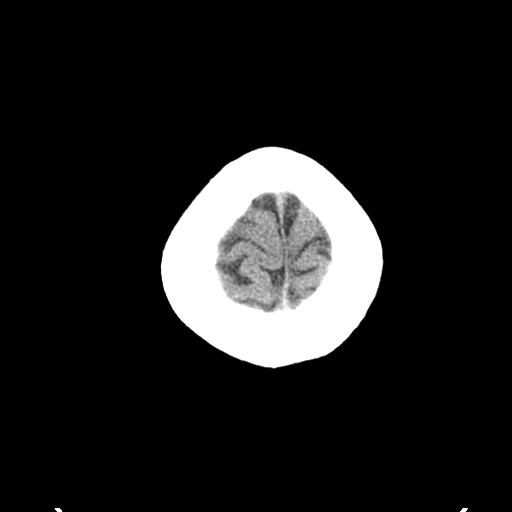
[im 30/33  brain]
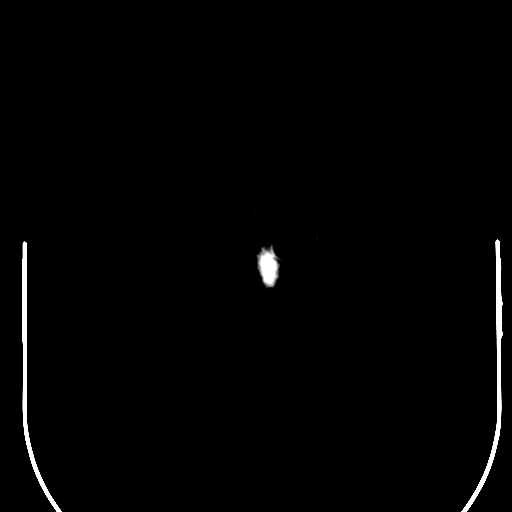

[Series 5: coronal soft tissue · coronal · 0.32mm/px · 3 of 68 slices shown]
[im 23/68  brain]
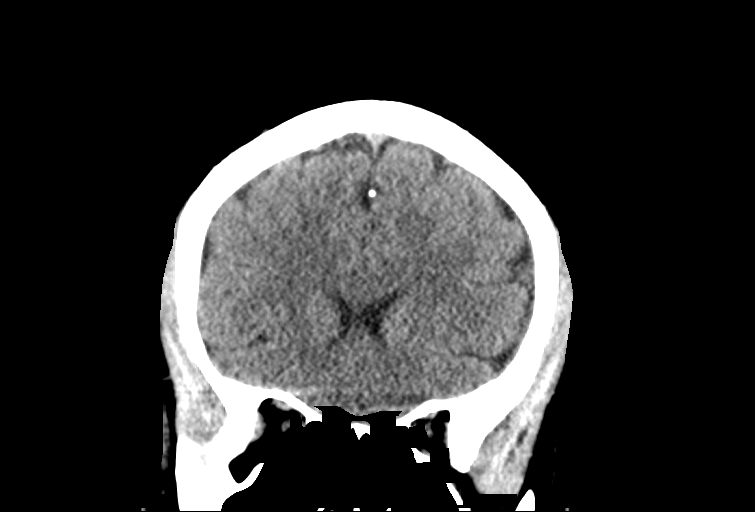
[im 30/68  brain]
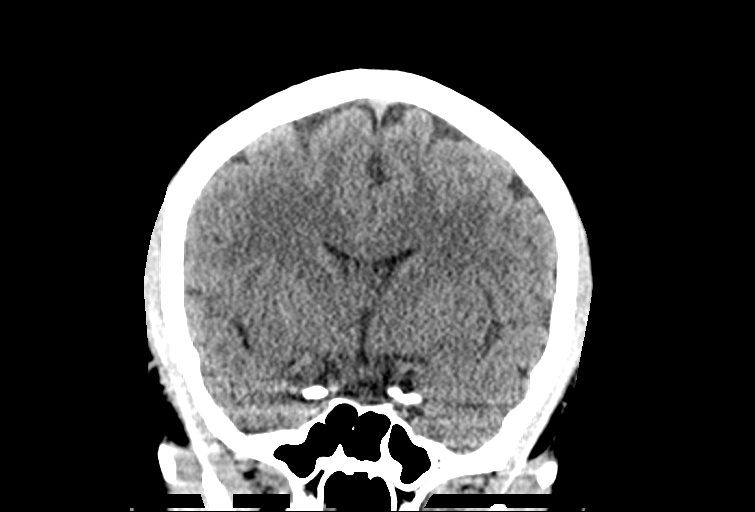
[im 38/68  brain]
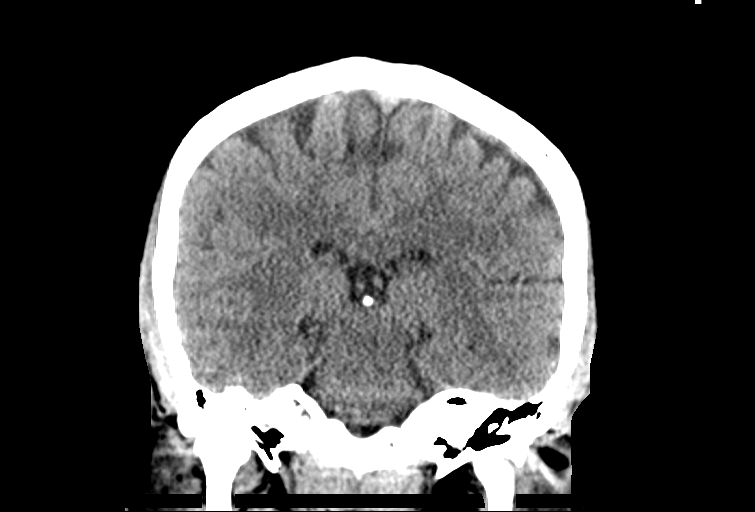

[Series 6: sagittal soft tissue · sagittal · 0.32mm/px · 3 of 61 slices shown]
[im 21/61  brain]
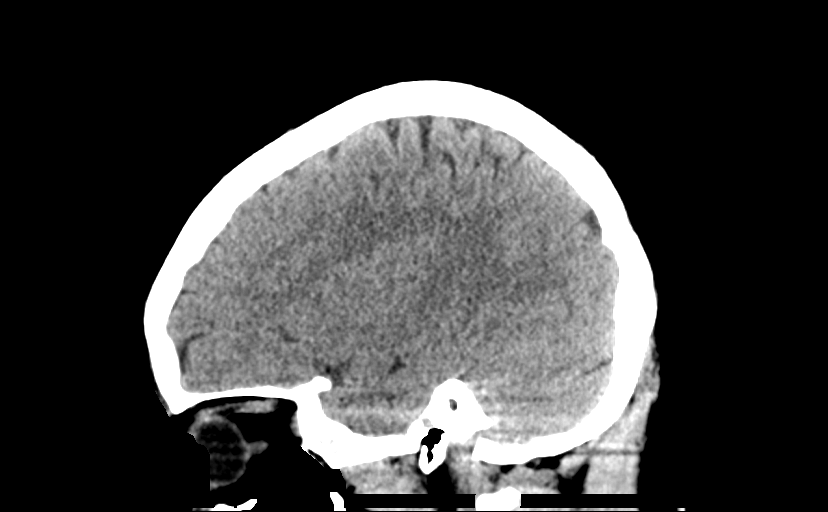
[im 31/61  brain]
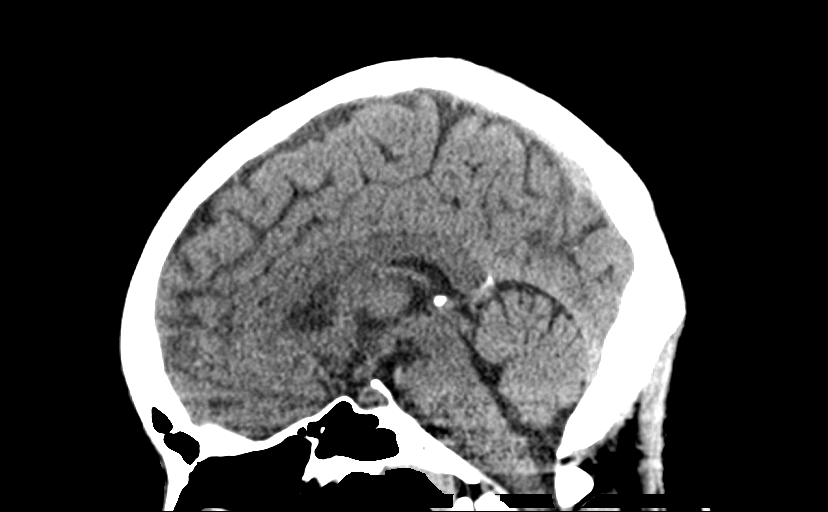
[im 41/61  brain]
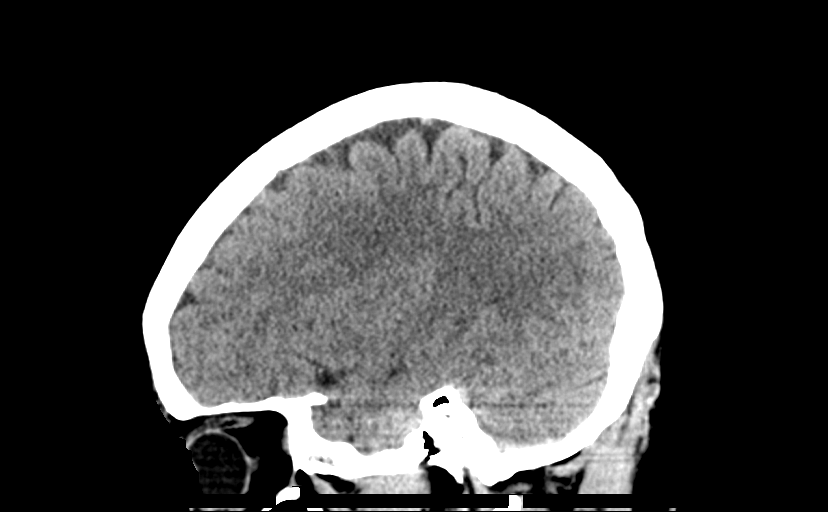

[14 of 47 positions shown; findings below may reference images not displayed]

FINDINGS: Brain: No acute intracranial abnormality. Specifically, no
hemorrhage, hydrocephalus, mass lesion, acute infarction, or
significant intracranial injury.

Vascular: No hyperdense vessel or unexpected calcification.

Skull: No acute calvarial abnormality.

Sinuses/Orbits: No acute findings

Other: None
IMPRESSION: Normal study.

## 2023-03-04 ENCOUNTER — Emergency Department (HOSPITAL_COMMUNITY)
Admission: EM | Admit: 2023-03-04 | Discharge: 2023-03-04 | Disposition: A | Discharge status: Home / Self Care | Payer: MEDICAID | Attending: Emergency Medicine | Admitting: Emergency Medicine

## 2023-03-04 DIAGNOSIS — Z21 Asymptomatic human immunodeficiency virus [HIV] infection status: Secondary | ICD-10-CM | POA: Diagnosis not present

## 2023-03-04 DIAGNOSIS — M25572 Pain in left ankle and joints of left foot: Secondary | ICD-10-CM | POA: Insufficient documentation

## 2023-03-04 DIAGNOSIS — Z23 Encounter for immunization: Secondary | ICD-10-CM | POA: Insufficient documentation

## 2023-03-04 DIAGNOSIS — S0181XA Laceration without foreign body of other part of head, initial encounter: Secondary | ICD-10-CM | POA: Insufficient documentation

## 2023-03-04 DIAGNOSIS — Z202 Contact with and (suspected) exposure to infections with a predominantly sexual mode of transmission: Secondary | ICD-10-CM

## 2023-03-04 DIAGNOSIS — W06XXXA Fall from bed, initial encounter: Secondary | ICD-10-CM | POA: Insufficient documentation

## 2023-03-04 DIAGNOSIS — M25571 Pain in right ankle and joints of right foot: Secondary | ICD-10-CM | POA: Insufficient documentation

## 2023-03-04 DIAGNOSIS — M25511 Pain in right shoulder: Secondary | ICD-10-CM | POA: Insufficient documentation

## 2023-03-04 NOTE — ED Triage Notes (Signed)
Pt will not speak. Pt writes a message stating she needs antibiotics for syphilis.

## 2023-03-04 NOTE — ED Notes (Signed)
Unable to complete entire triage questions due to pt not speaking.

## 2023-03-04 NOTE — ED Notes (Signed)
Pt in exam room with father and will not speak. She was writing in her notepad. She was asking for medication for syphilis. When patient was advised blood work needed to be drawn, she got dressed and left.

## 2023-03-04 NOTE — ED Provider Notes (Signed)
[place] EMERGENCY DEPARTMENT AT [facility] Provider Note   CSN: [id number] Arrival date & time: 03/04/23  4098     History  Chief Complaint  Patient presents with   SEXUALLY TRANSMITTED DISEASE    Corazon Nickolas is a 23 y.o. female past medical history significant for schizophrenia presents today with her father for concern for syphilis.  Patient is refusing to speak and communicating by writing in a notebook.  Patient's father states that patient was given a penicillin shot by the health department after these results earlier this week but is unsure if any other treatment is needed.  Patient denies ever getting that shot.  Patient refusing any labs and just wants treatment, however explained to patient that we cannot see the health department laboratory results in our system.  HPI     Home Medications Prior to Admission medications   Medication Sig Start Date End Date Taking? Authorizing Provider  benzonatate (TESSALON) 100 MG capsule Take 1 capsule (100 mg total) by mouth every 8 (eight) hours. 02/18/23   Anders Simmonds T, DO  ibuprofen (ADVIL) 600 MG tablet Take 1 tablet (600 mg total) by mouth every 6 (six) hours as needed for headache, mild pain, moderate pain or cramping. Patient not taking: Reported on 01/21/2023 12/12/21   Warden Fillers, MD  oxyCODONE (OXY IR/ROXICODONE) 5 MG immediate release tablet Take 1 tablet (5 mg total) by mouth every 6 (six) hours as needed for severe pain or breakthrough pain. Patient not taking: Reported on 01/21/2023 12/12/21   Warden Fillers, MD      Allergies    Patient has no known allergies.    Review of Systems   Review of Systems  Reason unable to perform ROS: Patient refusing to speak.    Physical Exam Updated Vital Signs BP 115/80 (BP Location: Right Arm)   Pulse 73   Temp 98.3 F (36.8 C)   Resp 17   SpO2 100%  Physical Exam Vitals and nursing note reviewed.  Constitutional:      General: She is not in acute  distress.    Appearance: Normal appearance. She is well-developed. She is not ill-appearing, toxic-appearing or diaphoretic.  HENT:     Head: Normocephalic and atraumatic.     Right Ear: External ear normal.     Left Ear: External ear normal.     Nose: Nose normal.  Eyes:     Extraocular Movements: Extraocular movements intact.     Conjunctiva/sclera: Conjunctivae normal.  Cardiovascular:     Rate and Rhythm: Normal rate.  Pulmonary:     Effort: Pulmonary effort is normal.  Abdominal:     Palpations: Abdomen is soft.  Musculoskeletal:        General: No swelling. Normal range of motion.     Cervical back: Normal range of motion and neck supple.  Skin:    General: Skin is warm and dry.     Capillary Refill: Capillary refill takes less than 2 seconds.  Neurological:     General: No focal deficit present.     Mental Status: She is alert and oriented to person, place, and time.     Gait: Gait normal.  Psychiatric:        Mood and Affect: Mood normal.        Speech: She is noncommunicative.     ED Results / Procedures / Treatments   Labs (all labs ordered are listed, but only abnormal results are displayed) Labs Reviewed  URINALYSIS, ROUTINE W REFLEX MICROSCOPIC  RPR  HIV ANTIBODY (ROUTINE TESTING W REFLEX)    EKG None  Radiology No results found.  Procedures Procedures    Medications Ordered in ED Medications - No data to display  ED Course/ Medical Decision Making/ A&P                                 Medical Decision Making Amount and/or Complexity of Data Reviewed Labs: ordered.   This patient presents to the ED with chief complaint(s) of concern for syphilis with pertinent past medical history of schizophrenia which further complicates the presenting complaint. The complaint involves an extensive differential diagnosis and also carries with it a high risk of complications and morbidity.    The differential diagnosis includes syphilis, HIV  Additional  history obtained: Additional history obtained from family Records reviewed Primary Care Documents  ED Course and Reassessment: Patient refused labs and walked out of ED wrote she was going to go back to the other hospital to get her medicine.  Explained to patient that we could not see the health departments labs and would need to draw our own in order to test the patient for syphilis or other STDs.          Final Clinical Impression(s) / ED Diagnoses Final diagnoses:  Possible exposure to STD    Rx / DC Orders ED Discharge Orders     None         Dolphus Jenny, PA-C 03/04/23 0933    Anders Simmonds T, DO 03/05/23 0710

## 2023-03-04 NOTE — ED Notes (Signed)
Patient seen walking out of hospital.  Attempted to stop patient and patient continued walking out and ignored staff.
